# Patient Record
Sex: Female | Born: 1945
Health system: Southern US, Community
[De-identification: ages and names within clinical notes are randomized; demographics above are authoritative.]

## PROBLEM LIST (undated history)

## (undated) DIAGNOSIS — R911 Solitary pulmonary nodule: Secondary | ICD-10-CM

## (undated) DIAGNOSIS — R011 Cardiac murmur, unspecified: Secondary | ICD-10-CM

## (undated) DIAGNOSIS — M1990 Unspecified osteoarthritis, unspecified site: Secondary | ICD-10-CM

## (undated) DIAGNOSIS — H538 Other visual disturbances: Secondary | ICD-10-CM

## (undated) DIAGNOSIS — I10 Essential (primary) hypertension: Secondary | ICD-10-CM

## (undated) DIAGNOSIS — J302 Other seasonal allergic rhinitis: Secondary | ICD-10-CM

## (undated) DIAGNOSIS — E785 Hyperlipidemia, unspecified: Secondary | ICD-10-CM

## (undated) DIAGNOSIS — C801 Malignant (primary) neoplasm, unspecified: Secondary | ICD-10-CM

## (undated) DIAGNOSIS — F32A Depression, unspecified: Secondary | ICD-10-CM

## (undated) DIAGNOSIS — F329 Major depressive disorder, single episode, unspecified: Secondary | ICD-10-CM

## (undated) DIAGNOSIS — I2510 Atherosclerotic heart disease of native coronary artery without angina pectoris: Secondary | ICD-10-CM

## (undated) HISTORY — DX: Malignant (primary) neoplasm, unspecified: C80.1

## (undated) HISTORY — DX: Depression, unspecified: F32.A

## (undated) HISTORY — DX: Essential (primary) hypertension: I10

## (undated) HISTORY — DX: Unspecified osteoarthritis, unspecified site: M19.90

## (undated) HISTORY — PX: CATARACT EXTRACTION: SUR2

## (undated) HISTORY — DX: Solitary pulmonary nodule: R91.1

## (undated) HISTORY — DX: Cardiac murmur, unspecified: R01.1

## (undated) HISTORY — PX: TOTAL HIP ARTHROPLASTY: SHX124

## (undated) HISTORY — DX: Atherosclerotic heart disease of native coronary artery without angina pectoris: I25.10

## (undated) HISTORY — DX: Other seasonal allergic rhinitis: J30.2

## (undated) HISTORY — DX: Hyperlipidemia, unspecified: E78.5

## (undated) HISTORY — PX: BREAST BIOPSY: SHX20

## (undated) HISTORY — PX: SQUAMOUS CELL CARCINOMA EXCISION: SHX2433

## (undated) HISTORY — DX: Other visual disturbances: H53.8

## (undated) HISTORY — PX: COLONOSCOPY: SHX174

## (undated) HISTORY — PX: ABDOMINAL HYSTERECTOMY: SHX81

---
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
## 1998-03-21 ENCOUNTER — Other Ambulatory Visit: Admission: RE | Admit: 1998-03-21 | Discharge: 1998-03-21 | Payer: Self-pay | Admitting: Obstetrics and Gynecology

## 1999-05-04 ENCOUNTER — Encounter (INDEPENDENT_AMBULATORY_CARE_PROVIDER_SITE_OTHER): Payer: Self-pay | Admitting: Specialist

## 1999-05-04 ENCOUNTER — Other Ambulatory Visit: Admission: RE | Admit: 1999-05-04 | Discharge: 1999-05-04 | Payer: Self-pay | Admitting: Obstetrics and Gynecology

## 1999-05-06 ENCOUNTER — Other Ambulatory Visit: Admission: RE | Admit: 1999-05-06 | Discharge: 1999-05-06 | Payer: Self-pay | Admitting: Obstetrics and Gynecology

## 1999-09-21 ENCOUNTER — Encounter (INDEPENDENT_AMBULATORY_CARE_PROVIDER_SITE_OTHER): Payer: Self-pay

## 1999-09-21 ENCOUNTER — Other Ambulatory Visit: Admission: RE | Admit: 1999-09-21 | Discharge: 1999-09-21 | Payer: Self-pay | Admitting: Internal Medicine

## 2000-06-03 ENCOUNTER — Other Ambulatory Visit: Admission: RE | Admit: 2000-06-03 | Discharge: 2000-06-03 | Payer: Self-pay | Admitting: Obstetrics and Gynecology

## 2001-06-09 ENCOUNTER — Other Ambulatory Visit: Admission: RE | Admit: 2001-06-09 | Discharge: 2001-06-09 | Payer: Self-pay | Admitting: Obstetrics and Gynecology

## 2001-10-12 ENCOUNTER — Encounter (INDEPENDENT_AMBULATORY_CARE_PROVIDER_SITE_OTHER): Payer: Self-pay | Admitting: Specialist

## 2001-10-12 ENCOUNTER — Ambulatory Visit (HOSPITAL_COMMUNITY): Admission: RE | Admit: 2001-10-12 | Discharge: 2001-10-12 | Payer: Self-pay | Admitting: Obstetrics and Gynecology

## 2002-01-04 HISTORY — PX: REDUCTION MAMMAPLASTY: SUR839

## 2002-06-13 ENCOUNTER — Other Ambulatory Visit: Admission: RE | Admit: 2002-06-13 | Discharge: 2002-06-13 | Payer: Self-pay | Admitting: Obstetrics and Gynecology

## 2002-12-04 ENCOUNTER — Encounter (INDEPENDENT_AMBULATORY_CARE_PROVIDER_SITE_OTHER): Payer: Self-pay

## 2002-12-04 ENCOUNTER — Observation Stay (HOSPITAL_COMMUNITY): Admission: RE | Admit: 2002-12-04 | Discharge: 2002-12-05 | Payer: Self-pay | Admitting: Obstetrics and Gynecology

## 2003-02-19 ENCOUNTER — Encounter: Payer: Self-pay | Admitting: Internal Medicine

## 2003-11-19 ENCOUNTER — Ambulatory Visit: Payer: Self-pay | Admitting: Internal Medicine

## 2003-12-06 ENCOUNTER — Ambulatory Visit: Payer: Self-pay | Admitting: Internal Medicine

## 2003-12-12 ENCOUNTER — Other Ambulatory Visit: Admission: RE | Admit: 2003-12-12 | Discharge: 2003-12-12 | Payer: Self-pay | Admitting: Obstetrics and Gynecology

## 2004-03-19 ENCOUNTER — Ambulatory Visit: Payer: Self-pay | Admitting: Internal Medicine

## 2004-03-26 ENCOUNTER — Ambulatory Visit: Payer: Self-pay | Admitting: Internal Medicine

## 2004-07-15 ENCOUNTER — Ambulatory Visit: Payer: Self-pay | Admitting: Internal Medicine

## 2005-01-07 ENCOUNTER — Other Ambulatory Visit: Admission: RE | Admit: 2005-01-07 | Discharge: 2005-01-07 | Payer: Self-pay | Admitting: Obstetrics and Gynecology

## 2005-03-04 ENCOUNTER — Ambulatory Visit: Payer: Self-pay | Admitting: Internal Medicine

## 2005-03-23 ENCOUNTER — Ambulatory Visit: Payer: Self-pay | Admitting: Internal Medicine

## 2005-03-29 ENCOUNTER — Ambulatory Visit: Payer: Self-pay | Admitting: Internal Medicine

## 2006-02-28 ENCOUNTER — Ambulatory Visit: Payer: Self-pay | Admitting: Internal Medicine

## 2006-02-28 LAB — CONVERTED CEMR LAB
ALT: 24 units/L (ref 0–40)
AST: 24 units/L (ref 0–37)
BUN: 10 mg/dL (ref 6–23)
Cholesterol: 185 mg/dL (ref 0–200)
Creatinine, Ser: 0.9 mg/dL (ref 0.4–1.2)
Creatinine,U: 54.3 mg/dL
HDL: 65.9 mg/dL (ref >39.0)
Hgb A1c MFr Bld: 5.7 % (ref 4.6–6.0)
LDL Cholesterol: 86 mg/dL (ref 0–99)
Microalb Creat Ratio: 38.7 mg/g — ABNORMAL HIGH (ref 0.0–30.0)
Microalb, Ur: 2.1 mg/dL — ABNORMAL HIGH (ref 0.0–1.9)
Potassium: 4.5 meq/L (ref 3.5–5.1)
Total CHOL/HDL Ratio: 2.8
Triglycerides: 164 mg/dL — ABNORMAL HIGH (ref 0–149)
VLDL: 33 mg/dL (ref 0–40)
Vit D, 1,25-Dihydroxy: 46 (ref 20–57)

## 2007-04-19 ENCOUNTER — Telehealth (INDEPENDENT_AMBULATORY_CARE_PROVIDER_SITE_OTHER): Payer: Self-pay | Admitting: *Deleted

## 2007-04-20 ENCOUNTER — Ambulatory Visit: Payer: Self-pay | Admitting: Family Medicine

## 2007-04-20 ENCOUNTER — Encounter: Payer: Self-pay | Admitting: Internal Medicine

## 2007-05-08 ENCOUNTER — Encounter (INDEPENDENT_AMBULATORY_CARE_PROVIDER_SITE_OTHER): Payer: Self-pay | Admitting: *Deleted

## 2007-07-24 DIAGNOSIS — I10 Essential (primary) hypertension: Secondary | ICD-10-CM | POA: Insufficient documentation

## 2007-07-26 ENCOUNTER — Ambulatory Visit: Payer: Self-pay | Admitting: Internal Medicine

## 2007-07-26 DIAGNOSIS — M899 Disorder of bone, unspecified: Secondary | ICD-10-CM | POA: Insufficient documentation

## 2007-07-26 DIAGNOSIS — E785 Hyperlipidemia, unspecified: Secondary | ICD-10-CM | POA: Insufficient documentation

## 2007-07-26 DIAGNOSIS — Z860100 Personal history of colon polyps, unspecified: Secondary | ICD-10-CM | POA: Insufficient documentation

## 2007-07-26 DIAGNOSIS — M949 Disorder of cartilage, unspecified: Secondary | ICD-10-CM

## 2007-07-26 LAB — CONVERTED CEMR LAB
Cholesterol, target level: 200 mg/dL
HDL goal, serum: 50 mg/dL
LDL Goal: 125 mg/dL

## 2007-08-04 ENCOUNTER — Encounter (INDEPENDENT_AMBULATORY_CARE_PROVIDER_SITE_OTHER): Payer: Self-pay | Admitting: *Deleted

## 2007-08-04 ENCOUNTER — Ambulatory Visit: Payer: Self-pay | Admitting: Internal Medicine

## 2007-08-04 LAB — CONVERTED CEMR LAB
ALT: 25 units/L (ref 0–35)
AST: 24 units/L (ref 0–37)
Albumin: 4.3 g/dL (ref 3.5–5.2)
Alkaline Phosphatase: 50 units/L (ref 39–117)
BUN: 12 mg/dL (ref 6–23)
Basophils Absolute: 0 10*3/uL (ref 0.0–0.1)
Basophils Relative: 0.4 % (ref 0.0–3.0)
Bilirubin, Direct: 0.1 mg/dL (ref 0.0–0.3)
CO2: 34 meq/L — ABNORMAL HIGH (ref 19–32)
Calcium: 9.5 mg/dL (ref 8.4–10.5)
Chloride: 101 meq/L (ref 96–112)
Cholesterol: 150 mg/dL (ref 0–200)
Creatinine, Ser: 0.9 mg/dL (ref 0.4–1.2)
Eosinophils Absolute: 0.2 10*3/uL (ref 0.0–0.7)
Eosinophils Relative: 3.9 % (ref 0.0–5.0)
GFR calc Af Amer: 82 mL/min
GFR calc non Af Amer: 68 mL/min
Glucose, Bld: 102 mg/dL — ABNORMAL HIGH (ref 70–99)
HCT: 39.5 % (ref 36.0–46.0)
HDL: 61.2 mg/dL (ref >39.0)
Hemoglobin: 13.6 g/dL (ref 12.0–15.0)
Hgb A1c MFr Bld: 6 % (ref 4.6–6.0)
LDL Cholesterol: 71 mg/dL (ref 0–99)
Lymphocytes Relative: 35.8 % (ref 12.0–46.0)
MCHC: 34.4 g/dL (ref 30.0–36.0)
MCV: 94.2 fL (ref 78.0–100.0)
Monocytes Absolute: 0.3 10*3/uL (ref 0.1–1.0)
Monocytes Relative: 5.8 % (ref 3.0–12.0)
Neutro Abs: 2.3 10*3/uL (ref 1.4–7.7)
Neutrophils Relative %: 54.1 % (ref 43.0–77.0)
OCCULT 1: NEGATIVE
OCCULT 2: NEGATIVE
OCCULT 3: NEGATIVE
Platelets: 246 10*3/uL (ref 150–400)
Potassium: 4.2 meq/L (ref 3.5–5.1)
RBC: 4.2 M/uL (ref 3.87–5.11)
RDW: 11.9 % (ref 11.5–14.6)
Sodium: 140 meq/L (ref 135–145)
TSH: 1.28 microintl units/mL (ref 0.35–5.50)
Total Bilirubin: 0.9 mg/dL (ref 0.3–1.2)
Total CHOL/HDL Ratio: 2.5
Total Protein: 6.9 g/dL (ref 6.0–8.3)
Triglycerides: 91 mg/dL (ref 0–149)
VLDL: 18 mg/dL (ref 0–40)
Vit D, 1,25-Dihydroxy: 42 (ref 30–89)
WBC: 4.4 10*3/uL — ABNORMAL LOW (ref 4.5–10.5)

## 2008-06-14 ENCOUNTER — Ambulatory Visit: Payer: Self-pay | Admitting: Family Medicine

## 2008-06-14 ENCOUNTER — Ambulatory Visit (HOSPITAL_COMMUNITY): Admission: RE | Admit: 2008-06-14 | Discharge: 2008-06-14 | Payer: Self-pay | Admitting: Family Medicine

## 2008-06-14 LAB — CONVERTED CEMR LAB
Bilirubin Urine: NEGATIVE
Blood in Urine, dipstick: NEGATIVE
Glucose, Urine, Semiquant: NEGATIVE
Ketones, urine, test strip: NEGATIVE
Nitrite: NEGATIVE
Protein, U semiquant: NEGATIVE
Specific Gravity, Urine: 1.015
Urobilinogen, UA: 0.2
WBC Urine, dipstick: NEGATIVE
pH: 6

## 2008-06-17 ENCOUNTER — Telehealth (INDEPENDENT_AMBULATORY_CARE_PROVIDER_SITE_OTHER): Payer: Self-pay | Admitting: *Deleted

## 2008-07-26 ENCOUNTER — Ambulatory Visit: Payer: Self-pay | Admitting: Internal Medicine

## 2008-07-26 DIAGNOSIS — R7309 Other abnormal glucose: Secondary | ICD-10-CM | POA: Insufficient documentation

## 2008-07-31 ENCOUNTER — Encounter (INDEPENDENT_AMBULATORY_CARE_PROVIDER_SITE_OTHER): Payer: Self-pay | Admitting: *Deleted

## 2008-07-31 LAB — CONVERTED CEMR LAB
ALT: 20 units/L (ref 0–35)
AST: 24 units/L (ref 0–37)
Albumin: 4.3 g/dL (ref 3.5–5.2)
Alkaline Phosphatase: 62 units/L (ref 39–117)
BUN: 12 mg/dL (ref 6–23)
Basophils Absolute: 0 10*3/uL (ref 0.0–0.1)
Basophils Relative: 0 % (ref 0.0–3.0)
Bilirubin, Direct: 0 mg/dL (ref 0.0–0.3)
CO2: 33 meq/L — ABNORMAL HIGH (ref 19–32)
Calcium: 9.7 mg/dL (ref 8.4–10.5)
Chloride: 101 meq/L (ref 96–112)
Cholesterol: 172 mg/dL (ref 0–200)
Creatinine, Ser: 0.8 mg/dL (ref 0.4–1.2)
Eosinophils Absolute: 0.2 10*3/uL (ref 0.0–0.7)
Eosinophils Relative: 3.9 % (ref 0.0–5.0)
GFR calc non Af Amer: 77.45 mL/min (ref >60)
Glucose, Bld: 87 mg/dL (ref 70–99)
HCT: 39.3 % (ref 36.0–46.0)
HDL: 72.5 mg/dL (ref >39.00)
Hemoglobin: 13.6 g/dL (ref 12.0–15.0)
Hgb A1c MFr Bld: 5.7 % (ref 4.6–6.5)
LDL Cholesterol: 81 mg/dL (ref 0–99)
Lymphocytes Relative: 40 % (ref 12.0–46.0)
Lymphs Abs: 1.9 10*3/uL (ref 0.7–4.0)
MCHC: 34.7 g/dL (ref 30.0–36.0)
MCV: 94.5 fL (ref 78.0–100.0)
Monocytes Absolute: 0.2 10*3/uL (ref 0.1–1.0)
Monocytes Relative: 5 % (ref 3.0–12.0)
Neutro Abs: 2.4 10*3/uL (ref 1.4–7.7)
Neutrophils Relative %: 51.1 % (ref 43.0–77.0)
Platelets: 249 10*3/uL (ref 150.0–400.0)
Potassium: 4.1 meq/L (ref 3.5–5.1)
RBC: 4.16 M/uL (ref 3.87–5.11)
RDW: 11.9 % (ref 11.5–14.6)
Sodium: 141 meq/L (ref 135–145)
TSH: 1.45 microintl units/mL (ref 0.35–5.50)
Total Bilirubin: 1 mg/dL (ref 0.3–1.2)
Total CHOL/HDL Ratio: 2
Total Protein: 7.2 g/dL (ref 6.0–8.3)
Triglycerides: 95 mg/dL (ref 0.0–149.0)
VLDL: 19 mg/dL (ref 0.0–40.0)
WBC: 4.7 10*3/uL (ref 4.5–10.5)

## 2008-08-05 ENCOUNTER — Encounter (INDEPENDENT_AMBULATORY_CARE_PROVIDER_SITE_OTHER): Payer: Self-pay | Admitting: *Deleted

## 2008-08-05 LAB — CONVERTED CEMR LAB: Vit D, 25-Hydroxy: 35 ng/mL

## 2008-12-17 ENCOUNTER — Telehealth (INDEPENDENT_AMBULATORY_CARE_PROVIDER_SITE_OTHER): Payer: Self-pay | Admitting: *Deleted

## 2008-12-26 ENCOUNTER — Encounter (INDEPENDENT_AMBULATORY_CARE_PROVIDER_SITE_OTHER): Payer: Self-pay | Admitting: *Deleted

## 2009-02-06 ENCOUNTER — Encounter (INDEPENDENT_AMBULATORY_CARE_PROVIDER_SITE_OTHER): Payer: Self-pay | Admitting: *Deleted

## 2009-02-07 ENCOUNTER — Ambulatory Visit: Payer: Self-pay | Admitting: Internal Medicine

## 2009-02-10 ENCOUNTER — Ambulatory Visit: Payer: Self-pay | Admitting: Internal Medicine

## 2009-02-11 ENCOUNTER — Telehealth (INDEPENDENT_AMBULATORY_CARE_PROVIDER_SITE_OTHER): Payer: Self-pay | Admitting: *Deleted

## 2009-02-20 ENCOUNTER — Encounter: Payer: Self-pay | Admitting: Internal Medicine

## 2009-03-19 ENCOUNTER — Telehealth (INDEPENDENT_AMBULATORY_CARE_PROVIDER_SITE_OTHER): Payer: Self-pay | Admitting: *Deleted

## 2009-04-22 ENCOUNTER — Telehealth: Payer: Self-pay | Admitting: Internal Medicine

## 2009-04-25 ENCOUNTER — Ambulatory Visit (HOSPITAL_COMMUNITY): Admission: RE | Admit: 2009-04-25 | Discharge: 2009-04-25 | Payer: Self-pay | Admitting: Internal Medicine

## 2010-01-25 ENCOUNTER — Encounter: Payer: Self-pay | Admitting: Internal Medicine

## 2010-01-26 ENCOUNTER — Encounter: Payer: Self-pay | Admitting: Family Medicine

## 2010-02-03 NOTE — Letter (Signed)
Summary: Moviprep Instructions  [facility]  [address]   [place], [place] [address]   Phone: [phone]  Fax: [phone]       OTIS BURRESS    [birth year]    MRN: [id number]        Procedure Day /Date: 02-10-09 Monday     Arrival Time: 9:30 a.m.      Procedure Time: 10:30 a.m.     Location of Procedure:                     x   [facility] (4th Floor)                        PREPARATION FOR COLONOSCOPY WITH MOVIPREP   Starting 5 days prior to your procedure 02-05-09  do not eat nuts, seeds, popcorn, corn, beans, peas,  salads, or any raw vegetables.  Do not take any fiber supplements (e.g. Metamucil, Citrucel, and Benefiber).  THE DAY BEFORE YOUR PROCEDURE         DATE: 02-09-09    DAY: Monday  1.  Drink clear liquids the entire day-NO SOLID FOOD  2.  Do not drink anything colored red or purple.  Avoid juices with pulp.  No orange juice.  3.  Drink at least 64 oz. (8 glasses) of fluid/clear liquids during the day to prevent dehydration and help the prep work efficiently.  CLEAR LIQUIDS INCLUDE: Water Jello Ice Popsicles Tea (sugar ok, no milk/cream) Powdered fruit flavored drinks Coffee (sugar ok, no milk/cream) Gatorade Juice: apple, white grape, white cranberry  Lemonade Clear bullion, consomm, broth Carbonated beverages (any kind) Strained chicken noodle soup Hard Candy                             4.  In the morning, mix first dose of MoviPrep solution:    Empty 1 Pouch A and 1 Pouch B into the disposable container    Add lukewarm drinking water to the top line of the container. Mix to dissolve    Refrigerate (mixed solution should be used within 24 hrs)  5.  Begin drinking the prep at 5:00 p.m. The MoviPrep container is divided by 4 marks.   Every 15 minutes drink the solution down to the next mark (approximately 8 oz) until the full liter is complete.   6.  Follow completed prep with 16 oz of clear liquid of your choice  (Nothing red or purple).  Continue to drink clear liquids until bedtime.  7.  Before going to bed, mix second dose of MoviPrep solution:    Empty 1 Pouch A and 1 Pouch B into the disposable container    Add lukewarm drinking water to the top line of the container. Mix to dissolve    Refrigerate  THE DAY OF YOUR PROCEDURE      DATE:  02-10-09  DAY: Monday  Beginning at 5:30 a.m. (5 hours before procedure):         1. Every 15 minutes, drink the solution down to the next mark (approx 8 oz) until the full liter is complete.  2. Follow completed prep with 16 oz. of clear liquid of your choice.    3. You may drink clear liquids until  8:30 a.m.  (2 HOURS BEFORE PROCEDURE).   MEDICATION INSTRUCTIONS  Unless otherwise instructed, you should take regular prescription medications with a small sip of water  as early as possible the morning of your procedure.           OTHER INSTRUCTIONS  You will need a responsible adult at least 63 years of age to accompany you and drive you home.   This person must remain in the waiting room during your procedure.  Wear loose fitting clothing that is easily removed.  Leave jewelry and other valuables at home.  However, you may wish to bring a book to read or  an iPod/MP3 player to listen to music as you wait for your procedure to start.  Remove all body piercing jewelry and leave at home.  Total time from sign-in until discharge is approximately 2-3 hours.  You should go home directly after your procedure and rest.  You can resume normal activities the  day after your procedure.  The day of your procedure you should not:   Drive   Make legal decisions   Operate machinery   Drink alcohol   Return to work  You will receive specific instructions about eating, activities and medications before you leave.    The above instructions have been reviewed and explained to me by   Clide Cliff, RN______________________    I fully  understand and can verbalize these instructions _____________________________ Date _________

## 2010-02-03 NOTE — Progress Notes (Signed)
Summary: Triage   Phone Note Call from Patient Call back at [phone]   Caller: Patient Call For: Dr. Marina Goodell Reason for Call: Talk to Nurse Summary of Call: pt. is wanting to sch'd a Barium Enema Initial call taken by: Karna Christmas,  April 22, 2009 12:34 PM  Follow-up for Phone Call        Pt. is going to a meeting and asked that i call her back later.  Teryl Lucy RN  April 22, 2009 3:19 PM  Pt. scheduled for air contrast barium enema at [facility] on 04/25/09 at 9am. to be there at 8:45 am. She will go by there tomorrow and pick up prep kit. Follow-up by: Teryl Lucy RN,  April 22, 2009 5:06 PM

## 2010-02-03 NOTE — Miscellaneous (Signed)
Summary: RECALL COLON--CH.  Clinical Lists Changes  Medications: Added new medication of MOVIPREP 100 GM  SOLR (PEG-KCL-NACL-NASULF-NA ASC-C) As directed - Signed Rx of MOVIPREP 100 GM  SOLR (PEG-KCL-NACL-NASULF-NA ASC-C) As directed;  #1 x 0;  Signed;  Entered by: Clide Cliff RN;  Authorized by: Hilarie Fredrickson MD;  Method used: [facility]  [address] [phone]*, [address], [place], [place]  [address], Ph: [phone] or [phone], Fax: [phone] Observations: Added new observation of ALLERGY REV: Done (02/07/2009 12:44)    Prescriptions: MOVIPREP 100 GM  SOLR (PEG-KCL-NACL-NASULF-NA ASC-C) As directed  #1 x 0   Entered by:   Clide Cliff RN   Authorized by:   Hilarie Fredrickson MD   Signed by:   Clide Cliff RN on 02/07/2009   Method used:   [facility]        [facility]  [address] [phone]* (retail)       [address]       [place], [place]  [address]       Ph: [phone] or [phone]       Fax: [phone]   RxID:   [id number]

## 2010-02-03 NOTE — Procedures (Signed)
Summary: Colonoscopy  Patient: [name] Note: All result statuses are Final unless otherwise noted.  Tests: (1) Colonoscopy (COL)   COL Colonoscopy           DONE     [facility]     [address]     [place], [place]  [address]           COLONOSCOPY PROCEDURE REPORT           PATIENT:  Kathryn Lee, Kathryn Lee  MR#:  [id number]     BIRTHDATE:  01-19-1945, 63 yrs. old  GENDER:  female           ENDOSCOPIST:  Wilhemina Bonito. Eda Keys, MD     Referred by:  Surveillance Program Recall,           PROCEDURE DATE:  02/10/2009     PROCEDURE:  Surveillance Colonoscopy     ASA CLASS:  Class II     INDICATIONS:  history of pre-cancerous (adenomatous) colon polyps,     family history of colon cancer in parent (2001 hp; 2002 w/     adenoma; 2005 incomplete           MEDICATIONS:   Fentanyl 75 mcg IV, Versed 9 mg IV           DESCRIPTION OF PROCEDURE:   After the risks benefits and     alternatives of the procedure were thoroughly explained, informed     consent was obtained.  Digital rectal exam was performed and     revealed no abnormalities.   The LB CF-[id number] endoscope     was introduced through the anus and advanced to the mid transverse     colon, without limitations.  The quality of the prep was     excellent, using MoviPrep.  The instrument was then slowly     withdrawn as the colon was fully examined.     <<PROCEDUREIMAGES>>           FINDINGS:   The exam was again incomplete due to deep looping of     the colonoscope. The patient was moved side to side, pressure     applied, and repeat intubations tried without success. The     transverse, splenic flexure, descending, sigmoid colon, and rectum     appeared unremarkable.    Retroflexed views in the rectum revealed     internal hemorrhoids.    The scope was then withdrawn from the     patient and the procedure completed.           COMPLICATIONS:  None           ENDOSCOPIC IMPRESSION:     1) Normal colon to the  transverse portion     2) Incomplete exam as described above     3) Internal hemorrhoids     RECOMMENDATIONS:     1) Follow up  VIRTUAL COLONOSCOPY in 5 years     2) ACBE TO CLEAR PROXIMAL COLON OF POLYPS           ______________________________     Wilhemina Bonito. Eda Keys, MD           CC:  Pecola Lawless, MD; The Patient           n.     eSIGNED:   Wilhemina Bonito. Eda Keys at 02/10/2009 11:34 AM           Hermelinda Medicus, [id number]  Note: An exclamation  mark (!) indicates a result that was not dispersed into the flowsheet. Document Creation Date: 02/10/2009 11:35 AM _______________________________________________________________________  (1) Order result status: Final Collection or observation date-time: 02/10/2009 11:21 Requested date-time:  Receipt date-time:  Reported date-time:  Referring Physician:   Ordering Physician: Fransico Setters [phone]) Specimen Source:  Source: Launa Grill Order Number: [id number] Lab site:   Appended Document: Colonoscopy    Clinical Lists Changes  Observations: Added new observation of COLONNXTDUE: 02/2014 (02/10/2009 12:47)

## 2010-02-03 NOTE — Progress Notes (Signed)
Summary: Refill Request  Phone Note Refill Request Message from:  [facility] [address] Fax #: [phone]  Refills Requested: Medication #1:  LORAZEPAM 1 MG  TABS 1/2-1 at bedtime prn.   Dosage confirmed as above?Dosage Confirmed   Supply Requested: 3 months   Last Refilled: 09/10/2008 Initial call taken by: Harold Barban,  March 19, 2009 1:58 PM    Prescriptions: LORAZEPAM 1 MG  TABS (LORAZEPAM) 1/2-1 at bedtime prn  #60 x 1   Entered by:   Shonna Chock   Authorized by:   Marga Melnick MD   Signed by:   Shonna Chock on 03/19/2009   Method used:   Printed then faxed to ...       [facility]  [address] [phone]* (retail)       [address]       [place], [place]  [address]       Ph: [phone] or [phone]       Fax: [phone]   RxID:   [id number]

## 2010-02-03 NOTE — Letter (Signed)
Summary: Appointment Reminder  [facility]  [address] [place], [place] [address]   Phone: [phone]  Fax: [phone]        February 20, 2009 MRN: [id number]    Kathryn Lee [address] [place], [place]  [address]    Dear [name],   We have been unable to reach you by phone to schedule an air contrast barium enema that Dr.Perry recommended be done after doing colonoscopy on 02/10/2009.     We hope that you allow [place] to continue to participate in your health care needs. Please contact me at [phone] at your earliest convenience..     Sincerely,    Teryl Lucy RN

## 2010-02-03 NOTE — Progress Notes (Signed)
   Phone Note Outgoing Call   Summary of Call:  message left for pt. to call me back to schedule ACBE. Initial call taken by: Teryl Lucy RN,  February 11, 2009 10:56 AM  Follow-up for Phone Call         message left  again to call back   Teryl Lucy RN  February 17, 2009 9:22 AM  Follow-up by: Teryl Lucy RN,  February 17, 2009 9:22 AM  Additional Follow-up for Phone Call Additional follow up Details #1::        Pt. has not returned my calls about scheduling her x-ray so letter sent to pt. to call me. Additional Follow-up by: Teryl Lucy RN,  February 20, 2009 11:30 AM

## 2010-04-13 LAB — CREATININE, SERUM
Creatinine, Ser: 0.8 mg/dL (ref 0.4–1.2)
GFR calc Af Amer: >60 mL/min (ref >60)
GFR calc non Af Amer: >60 mL/min (ref >60)

## 2010-04-13 LAB — BUN: BUN: 8 mg/dL (ref 6–23)

## 2010-05-22 NOTE — Op Note (Signed)
Kathryn Lee, Kathryn Lee                      ACCOUNT NO.:  [id number]   MEDICAL RECORD NO.:  [id number]                   PATIENT TYPE:  AMB   LOCATION:  DAY                                  FACILITY:  [facility]   PHYSICIAN:  Daniel L. Eda Paschal, M.D.           DATE OF BIRTH:  May 28, 1945   DATE OF PROCEDURE:  12/04/2002  DATE OF DISCHARGE:                                 OPERATIVE REPORT   PREOPERATIVE DIAGNOSES:  1. Persistent postmenopausal bleeding.  2. Right adnexal pathology, possibly hydrosalpinx.   POSTOPERATIVE DIAGNOSES:  1. Persistent postmenopausal bleeding.  2. Multiple hydatid cysts.   OPERATION:  Vaginal hysterectomy, bilateral salpingo-oophorectomy.   SURGEON:  Daniel L. Eda Paschal, M.D.   FIRST ASSISTANT:  Timothy P. Fontaine, M.D.   FINDINGS:  The patient's uterus was just slightly enlarged, mobile, without  any evidence of pathology.  Left ovary was normal.  Left tube had a single  hydatid cyst.  Right ovary was normal.  Right fallopian tube had about four  or five multiple hydatid cysts, which gave it an appearance that could have  been consistent with a hydrosalpinx on ultrasound.  Other than this,  findings were normal.   DESCRIPTION OF PROCEDURE:  After adequate general endotracheal anesthesia,  the patient placed in the dorsal lithotomy position and prepped and draped  in the usual sterile manner.  A 1:200,000 solution of epinephrine and 0.5%  Xylocaine was injected around the cervix.  A 360 degree incision was made  around the cervix.  The bladder was mobilized superiorly, as was the  posterior peritoneum.  The posterior peritoneum and vesicouterine fold of  peritoneum were entered with sharp dissection.  The uterosacral ligaments  were clamped.  In clamping them, they were shortened.  They were sutured to  the vault laterally for good vault support.  Cardinal ligaments, uterine  arteries, balance of the broad ligament, round ligaments, and fallopian  tubes were successively clamped, cut, and suture ligated.  Suture material  for all the above-mentioned pedicles was #1 chromic catgut.  The uterine  arteries were doubly ligated.  At this point the uterus was delivered, sent  to pathology for tissue diagnosis.  The left adnexa could be visualized.  The IP ligament was clamped, cut, and doubly suture ligated with #1 chromic  catgut. The same procedure was done on the right, and once again the IP  ligament was doubly ligated with #1 chromic catgut.  Ovaries and tubes were  sent to pathology for tissue diagnosis.  The vaginal cuff was whipstitched  to the peritoneum with a running locked 0 Vicryl.  Two sponge, needle, and  instrument counts were correct.  The peritoneum and the cuff were then  closed with figure-of-eights of #1 chromic, picking up peritoneum  posteriorly in such a fashion to completely eliminate the cul-de-sac.  The  cuff was completely closed.  Two sponge, needle, and instrument counts  were correct.  A Foley catheter  was inserted, which drained clear urine.  Estimated blood loss for the entire procedure was 150 mL with none replaced.  Following this procedure she was repositioned for a reduction mammoplasty by  Alfredia Ferguson, M.D.                                               Daniel L. Eda Paschal, M.D.    Tonette Bihari  D:  12/04/2002  T:  12/04/2002  Job:  [id number]

## 2010-05-22 NOTE — Op Note (Signed)
   Kathryn Lee, Kathryn Lee                      ACCOUNT NO.:  [id number]   MEDICAL RECORD NO.:  [id number]                   PATIENT TYPE:  AMB   LOCATION:  SDC                                  FACILITY:  [facility]   PHYSICIAN:  Daniel L. Eda Paschal, M.D.           DATE OF BIRTH:  1945/03/09   DATE OF PROCEDURE:  10/12/2001  DATE OF DISCHARGE:                                 OPERATIVE REPORT   PREOPERATIVE DIAGNOSES:  1. Postmenopausal bleeding.  2. Endometrial polyp.   POSTOPERATIVE DIAGNOSES:  1. Postmenopausal bleeding.  2. Endometrial polyp.   NAME OF OPERATION:  Hysteroscopy, dilatation and curettage.   SURGEON:  Daniel L. Eda Paschal, M.D.   ANESTHESIA:  General.   INDICATIONS:  The patient is a 65 year old postmenopausal woman who stopped  her hormone replacement therapy in February.  Starting in July on no hormone  replacement therapy she has been having her period every two weeks.  On SIH  she has an endometrial polyp and she now enters the hospital for  hysteroscopy, D&C, and excision of the above.   FINDINGS:  External and vaginal is within normal limits.  Cervix is clean.  Uterus is retroverted, normal size and shape with first degree uterine  descensus.  Adnexa are palpably normal.  At the time of hysteroscopy patient  had a small endometrial polyp of about 1.5 cm coming off the posterior wall.  Other than this endometrial polyp the entire endometrial cavity and  intracervical cavity were normal.   PROCEDURE:  After adequate general anesthesia the patient was placed in the  dorsal lithotomy position, prepped and draped in the usual sterile manner.  A single tooth tenaculum was placed on the anterior lip of the cervix.  The  cervix was dilated to a number 31 Pratt dilator.  A hysteroscopic  examination was then done using the hysteroscopic resectoscope and using 3%  sorbitol to distend the intrauterine cavity.  Camera was used for  magnification.  The resectoscope was  utilized for the above.  An endometrial  polyp was identified on the posterior wall.  It was removed using several  size curettes and other endometrial sampling was also obtained.  The patient  was then rehysteroscoped and there was no defects left in the endometrial  cavity.  Estimated blood loss for the entire procedure was less than 50 cc  with none replaced.  Fluid deficit was small, probably less than 100 cc.  The patient tolerated procedure well and left the operating room in  satisfactory condition.                                               Daniel L. Eda Paschal, M.D.    Tonette Bihari  D:  10/12/2001  T:  10/12/2001  Job:  [id number]

## 2010-05-22 NOTE — Op Note (Signed)
NAMETRACYE, SZUCH                      ACCOUNT NO.:  [id number]   MEDICAL RECORD NO.:  [id number]                   PATIENT TYPE:  AMB   LOCATION:  DAY                                  FACILITY:  [facility]   PHYSICIAN:  Alfredia Ferguson, M.D.               DATE OF BIRTH:  January 16, 1945   DATE OF PROCEDURE:  12/04/2002  DATE OF DISCHARGE:                                 OPERATIVE REPORT   PREOPERATIVE DIAGNOSIS:  Symptomatic macromastia with slight asymmetry,  right side larger than left.   POSTOPERATIVE DIAGNOSIS:  Symptomatic macromastia with slight asymmetry,  right side larger than left.   OPERATION PERFORMED:  Bilateral reduction mammoplasty with inferior pedicle  technique.  398 g removed from left breast and 459 g removed from right  breast.   SURGEON:  Alfredia Ferguson, M.D.   FIRST ASSISTANT:  Jacky Kindle, R.N.F.A.   ANESTHESIA:  General endotracheal anesthesia.   INDICATION FOR SURGERY:  This is a 65 year old woman who is undergoing a  vaginal hysterectomy today.  She also has documented symptomatic  macromastia.  She wishes to be reduced from her present D cup to a small B  if possible.  Potential risks of the surgery, including over-reduction,  under-reduction, wound healing difficulties, vascular compromise to the  nipple, hematoma, seroma, bleeding postoperatively, the need for secondary  surgery, asymmetry of the breasts, and overall dissatisfaction were  discussed with the patient.  In spite of these and other risks discussed,  the patient wishes to proceed with the operation.   DESCRIPTION OF SURGERY:  Skin marks were placed on the day prior to surgery  outlining a Wise skin pattern for an inferior pedicle technique.  Following  the hysterectomy the patient's chest was prepped with Betadine and draped  with sterile drapes.  Attention was directed to the left breast, which was  the smaller of the two breasts.  An 8 cm wide inferiorly-based pedicle was  marked  within the confines of the previously-placed skin marks.  A 42 mm  diameter circle was drawn around the nipple.  The circle around the nipple  was incised.  The marked pedicle was incised and within the confines of this  incision, the pedicle was de-epithelialized.  The remainder of the  previously-placed marks were incised, including the inframammary crease  incision, the medial, superior, and lateral breast flap incisions.  The  pedicle was dissected away from the surrounding breast tissue, cutting on  the bias and going away from the midclavicular line medially and laterally  and going superiorly on the pedicle toward the chest wall.  This left  adequate connections to the chest wall to ensure vascular integrity of the  pedicle.  The superior, medial, and lateral breast flaps were now elevated  off the chest wall at the level of the pectoralis fascia.  The excess  dermoglandular tissue medially was dissected away from the medial breast  flap, thinning  the medial breast flap to approximately 3-4 cm in thickness.  The excess dermoglandular tissue from the superior breast flap was also  dissected away from the superior breast flap, thinning the superior breast  flap to approximately 3-4 cm in thickness.  The excess dermoglandular tissue  was dissected away from the lateral breast flap, thinning this breast flap  to 2-3 cm in thickness.  Excess tissue was now passed off the field and  weighed.  The total weight was 398 g from the left breast.  Hemostasis was  meticulously maintained throughout the dissection.  The wound was copiously  irrigated with warm saline irrigation.  The inferior corner of the vertical  limb of the medial and lateral breast flap was united to the midportion of  the inframammary crease incision with a 2-0 Vicryl suture.  The superior  corner of the vertical limb of the two breast flaps were united together  with a similar suture.  The nipple-areolar complex was placed  in its new  location and fixed in position using multiple interrupted 3-0 Vicryl sutures  for the dermis, followed by a running 4-0 Vicryl subcuticular for the skin  edges.  The inframammary crease incision was closed using multiple 3-0  Vicryl sutures for the dermis, followed by running 3-0 Vicryl subcuticular.  The vertical limb of the incision was closed in a similar fashion.  Attention was directed to the right breast, where an identical procedure was  performed, and 459 g was removed from the right breast.  Estimated blood  loss for the procedure was between 250 and 300 mL.  Hemostasis was ensured  prior to closure of both wounds.  The nipple-areolar complex was viable  bilaterally.  The patient's chest was now cleansed, dried, and a bulky  dressing was placed over the breast.  A circumferential wrap with a six-inch  Ace bandage was placed.  The patient tolerated the procedure well, was  awakened, extubated, and transported to the recovery room in satisfactory  condition.                                               Alfredia Ferguson, M.D.    WBB/MEDQ  D:  12/04/2002  T:  12/04/2002  Job:  [id number]

## 2010-05-22 NOTE — H&P (Signed)
Kathryn Lee, MANARD                      ACCOUNT NO.:  [id number]   MEDICAL RECORD NO.:  [id number]                   PATIENT TYPE:  AMB   LOCATION:  DAY                                  FACILITY:  [facility]   PHYSICIAN:  Daniel L. Eda Paschal, M.D.           DATE OF BIRTH:  Aug 03, 1945   DATE OF ADMISSION:  DATE OF DISCHARGE:                                HISTORY & PHYSICAL   CHIEF COMPLAINT:  Persistent postmenopausal bleeding.   HISTORY OF PRESENT ILLNESS:  The patient is a 65 year old, gravida 1, para  1, AB 0, who has now been postmenopausal for two and a half to three years.  For approximately the first year, she was on a hormone replacement therapy  but stopped it.  Since she stopped it, she had two episodes of  postmenopausal bleeding.  Endometrial biopsy was negative.  Sonohistogram  was done on the patient and had an endometrial polyp.  So she had a D&C  hysteroscopy with excision of endometrial polyps.   Since then, she has continued to have episodes of intermittent  postmenopausal bleeding.  She no longer has any evidence of a polyp and as a  result of this persistent postmenopausal bleeding and to avoid continued  resurveillance for malignancy, she now enters the hospital for vaginal  hysterectomy and bilateral salpingo-oophorectomy.  She would like to have  her ovaries removed even if it is not possible vaginally, so we will proceed  with laparoscopy following vaginal hysterectomy if appropriate to remove her  ovaries.  Please note that on her last ultrasound she did have a possible  hydrosalpinx of her right fallopian tube versus several small right ovarian  cyst.   Following this part of the surgery, if it goes well, she will undergo a  bilateral breast reduction procedure.   PAST MEDICAL HISTORY:  1. The patient has a history of elevated lipids and is on Pravachol.  2. She is hypertensive and is on Norvasc.  3. She also has thinning of her bones and is on  Fosamax.   ALLERGIES:  She is allergic to no drugs.   FAMILY HISTORY:  The patient has a family history of skin cancer.  Both her  parents have had colon cancer and her mother is hypertensive.   SOCIAL HISTORY:  She is a nonsmoker.  She drinks one glass of wine a day.  She does not use caffeine.   REVIEW OF SYMPTOMS:  Negative.  CARDIAC:  Reveals a history of hypertension  on Norvasc.  RESPIRATORY:  Negative.  GASTROINTESTINAL:  Negative.  GENITOURINARY:  Negative.  NEUROLOGICAL:  Negative except for bone thinning  on Fosamax.  ENDOCRINE:  Reveals elevated lipids on Pravachol.  IMMUNOLOGICAL:  Negative.  LYMPHATIC:  Negative.  PSYCHIATRIC:  Negative.   PHYSICAL EXAMINATION:  GENERAL:  The patient is a well-developed, well-  nourished female in no acute distress.  Blood pressure is 130/80, pulse is  80 and regular,  respirations 16 and nonlabored.  She is afebrile.  HEENT:  All within normal limits.  NECK:  Supple.  Trachea in the midline.  Thyroid is not enlarged.  LUNGS:  Clear to auscultation and percussion.  HEART:  No thrills, heaves, or murmurs.  BREASTS:  No masses.  ABDOMEN:  Soft without guarding, rebound, or masses.  PELVIC:  External and vaginal is within normal limits.  Cervix is clean.  Uterus is top normal, size and shape.  Adnexae are palpably normal.  Rectovaginal is confirmatory.  EXTREMITIES:  Within normal limits.   IMPRESSION:  Persistent postmenopausal bleeding.   PLAN:  Vaginal hysterectomy and bilateral salpingo-oophorectomy.                                               Daniel L. Eda Paschal, M.D.    Tonette Bihari  D:  12/03/2002  T:  12/03/2002  Job:  [id number]

## 2014-06-11 ENCOUNTER — Encounter: Payer: Self-pay | Admitting: Internal Medicine

## 2016-09-09 DIAGNOSIS — F419 Anxiety disorder, unspecified: Secondary | ICD-10-CM | POA: Diagnosis present

## 2017-08-29 DIAGNOSIS — K219 Gastro-esophageal reflux disease without esophagitis: Secondary | ICD-10-CM | POA: Diagnosis present

## 2018-06-07 ENCOUNTER — Other Ambulatory Visit: Payer: Self-pay | Admitting: Internal Medicine

## 2018-06-07 DIAGNOSIS — N6310 Unspecified lump in the right breast, unspecified quadrant: Secondary | ICD-10-CM

## 2018-06-15 ENCOUNTER — Other Ambulatory Visit: Payer: Self-pay

## 2018-06-23 ENCOUNTER — Ambulatory Visit
Admission: RE | Admit: 2018-06-23 | Discharge: 2018-06-23 | Disposition: A | Discharge status: Home / Self Care | Payer: Medicare Other | Source: Ambulatory Visit | Attending: Internal Medicine | Admitting: Internal Medicine

## 2018-06-23 ENCOUNTER — Other Ambulatory Visit: Payer: Self-pay

## 2018-06-23 ENCOUNTER — Other Ambulatory Visit: Payer: Self-pay | Admitting: Internal Medicine

## 2018-06-23 DIAGNOSIS — N6310 Unspecified lump in the right breast, unspecified quadrant: Secondary | ICD-10-CM

## 2018-06-27 ENCOUNTER — Ambulatory Visit
Admission: RE | Admit: 2018-06-27 | Discharge: 2018-06-27 | Disposition: A | Discharge status: Home / Self Care | Payer: Medicare Other | Source: Ambulatory Visit | Attending: Internal Medicine | Admitting: Internal Medicine

## 2018-06-27 ENCOUNTER — Other Ambulatory Visit: Payer: Self-pay

## 2018-06-27 DIAGNOSIS — N6310 Unspecified lump in the right breast, unspecified quadrant: Secondary | ICD-10-CM

## 2018-06-29 ENCOUNTER — Telehealth: Payer: Self-pay | Admitting: Internal Medicine

## 2018-06-29 NOTE — Telephone Encounter (Signed)
According to chart pt had a colon done in 2016 with a provider in [place]. She would need an OV prior to scheduling her procedure here.

## 2018-06-29 NOTE — Telephone Encounter (Signed)
There is a [facility] referral for pt to have colonoscopy.  Previous colonoscopy wasin 2011 with Dr. Henrene Pastor.  Pt c FHx of colon cancer.  There is no recall.  Please advise if we can schedule her for direct colon.

## 2018-07-05 ENCOUNTER — Telehealth: Payer: Self-pay

## 2018-07-05 NOTE — Telephone Encounter (Signed)
Phone screening complete 

## 2018-07-06 ENCOUNTER — Encounter: Payer: Self-pay | Admitting: Internal Medicine

## 2018-07-06 ENCOUNTER — Ambulatory Visit (INDEPENDENT_AMBULATORY_CARE_PROVIDER_SITE_OTHER): Payer: Medicare Other | Admitting: Internal Medicine

## 2018-07-06 VITALS — Ht 66.0 in | Wt 171.0 lb

## 2018-07-06 DIAGNOSIS — Z86010 Personal history of colonic polyps: Secondary | ICD-10-CM | POA: Diagnosis not present

## 2018-07-06 DIAGNOSIS — Z800 Family history of malignant neoplasm of digestive organs: Secondary | ICD-10-CM | POA: Diagnosis not present

## 2018-07-06 NOTE — Progress Notes (Signed)
HISTORY OF PRESENT ILLNESS:  Kathryn Lee is a 73 y.o. female, required registrar at [employer], with past medical history as listed below who presents today regarding the need for surveillance colonoscopy.  The patient reports a family history of colon cancer in both parents.  She is undergone multiple prior colonoscopies.  Colonoscopy 2001 with hyperplastic polyp.  Colonoscopy in 2002 with adenoma.  Colonoscopy in 2005 was incomplete.  Colonoscopy in 2011 was also incomplete due to redundant colon.  Barium enema in 2011 revealed no evidence of masses or polypoid lesions.  Incidental right-sided diverticulum and tortuosity of the sigmoid colon noted.  Most recent colonoscopy was performed at [facility] December 20, 2014.  Patient was said to have a complete exam to the cecum.  Exam was said to be limited by suboptimal preparation in the right colon.  Patient reports that she was still at that time did not complete her prep.  Left-sided diverticulosis noted.  2 diminutive rectosigmoid colon polyps were removed and found to be hyperplastic.  Due to the limitations of her preparation and her family history follow-up in 3 years was recommended.  She wishes to have the exam done at this time.  She tells me that her GI review of systems is negative.  No problems with constipation or rectal bleeding.  Review of x-ray and laboratory records reveals no relevant abnormalities.  REVIEW OF SYSTEMS:  All non-GI ROS negative e unless otherwise stated in the HPI (entirely negative)  Past Medical History:  Diagnosis Date  . Depression   . Hyperlipidemia   . Hypertension     Past Surgical History:  Procedure Laterality Date  . REDUCTION MAMMAPLASTY Bilateral 2004  . TOTAL HIP ARTHROPLASTY      Social History Kathryn Lee  reports that she has never smoked. She does not have any smokeless tobacco history on file. She reports current alcohol use. She reports that she does not use  drugs.  family history includes Alzheimer's disease in her mother; Colon cancer (age of onset: 49) in her mother; Colon cancer (age of onset: 36) in her father; Liver cancer in her father.  Allergies  Allergen Reactions  . Codeine   . Morphine        PHYSICAL EXAMINATION: No physical examination with telehealth visit  ASSESSMENT:  1.  Family history of colon cancer 2.  History of adenomatous colon polyp remotely 3.  Last colonoscopy elsewhere 2016 with hyperplastic polyps.  Limited by inadequate right colon preparation.  Now due for follow-up.  We discussed hard candy and antiemetics prophylactically to help in the preparation process 4.  History of REDUNDANT colon with INCOMPLETE examination on 2 occasions  PLAN:  1.  Colonoscopy.The nature of the procedure, as well as the risks, benefits, and alternatives were carefully and thoroughly reviewed with the patient. Ample time for discussion and questions allowed. The patient understood, was satisfied, and agreed to proceed. 2.  PRESCRIBE Zofran 4 mg; #2; take 1 by mouth 20 minutes prior to starting each prep session, for nausea  This telehealth medicine visit was initiated by the patient and consented for by the patient.  She was in her home and I was in my office during the encounter.  She understands her may be an associated professional charge for this service

## 2018-07-10 MED ORDER — NA SULFATE-K SULFATE-MG SULF 17.5-3.13-1.6 GM/177ML PO SOLN: 1.0000 | Freq: Once | ORAL | 0 refills | Status: AC

## 2018-07-10 MED ORDER — ONDANSETRON HCL 4 MG PO TABS: ORAL_TABLET | ORAL | 0 refills | Status: DC

## 2018-07-10 NOTE — Addendum Note (Signed)
Addended by: Audrea Muscat on: 07/10/2018 10:42 AM   Modules accepted: Orders

## 2018-07-10 NOTE — Patient Instructions (Signed)
You have been scheduled for a colonoscopy. Please follow written instructions given to you at your visit today.  Please pick up your prep supplies at the pharmacy within the next 1-3 days.  We have sent the following medications to your pharmacy for you to pick up at your convenience:  Zofran.  Take one tablet 20 minutes before drinking each half of the prep.

## 2018-08-02 ENCOUNTER — Ambulatory Visit: Payer: Self-pay

## 2018-08-02 ENCOUNTER — Encounter: Payer: Self-pay | Admitting: Orthopaedic Surgery

## 2018-08-02 ENCOUNTER — Other Ambulatory Visit: Payer: Self-pay

## 2018-08-02 ENCOUNTER — Ambulatory Visit (INDEPENDENT_AMBULATORY_CARE_PROVIDER_SITE_OTHER): Payer: Medicare Other | Admitting: Orthopaedic Surgery

## 2018-08-02 DIAGNOSIS — Z96643 Presence of artificial hip joint, bilateral: Secondary | ICD-10-CM | POA: Diagnosis not present

## 2018-08-02 DIAGNOSIS — M25552 Pain in left hip: Secondary | ICD-10-CM

## 2018-08-02 NOTE — Progress Notes (Signed)
Office Visit Note   Patient: Kathryn Lee           Date of Birth: 06-05-1945           MRN: [id number] Visit Date: 08/02/2018              Requested by: Leanna Battles, [address] [place],  [place] [id number] PCP: Leanna Battles, MD   Assessment & Plan: Visit Diagnoses:  1. Pain in left hip   2. History of bilateral total hip arthroplasty     Plan: I have told her for the short-term I would like her to get away from going barefoot for at least the next month and try an insert just in the right shoe because her right side is slightly shorter than left.  This may help with gait and decrease the pain that she is feeling.  I would also like to obtain a three-phase bone scan to rule out prosthetic loosening of the left hip given the lucency of seen at the superior lateral aspect of the acetabular component.  An MRI would also be helpful to assess the left hip and the structures around the left hip for any pathology that is causing her pain.  All question concerns were answered and addressed.  We will see her back in 4 weeks.  Follow-Up Instructions: Return in about 4 weeks (around 08/30/2018).   Orders:  Orders Placed This Encounter  Procedures  . Hand/UE Inj  . Foot Inj  . XR HIP UNILAT W OR W/O PELVIS 1V LEFT   No orders of the defined types were placed in this encounter.     Procedures: No procedures performed   Clinical Data: No additional findings.   Subjective: Chief Complaint  Patient presents with  . Left Hip - Pain  Patient is a very pleasant 73 year old female who has a history of bilateral hip replacements that were done in [facility].  They were done just a few years apart.  She has had some problems with her left hip hurting her.  That was done in 2017.  She does report mainly hamstring pain is where she points to and states that it is hamstring pain but occasionally she does get groin pain.  This is worse with activities.  She  has not a diabetic and not a smoker.  She denies any numbness and tingling in her feet or legs or weakness.  She does walk mainly barefooted.  She has been told that there may be a slight leg length discrepancy.  She is very satisfied with her right hip replacement.  She is incredibly active and does not need to get around with any assistive device at all.  She is up and down her feet and legs all day long he can squat easily.  She denies any sciatic symptoms.  She denies any back pain.  HPI  Review of Systems She currently denies any headache, chest pain, shortness of breath, fever, chills, nausea, vomiting  Objective: Vital Signs: There were no vitals taken for this visit.  Physical Exam She is alert and orient x3 and in no acute distress Ortho Exam Examination of both hips show the move fluidly and fully with no pain in the groin at all my exam today.  Her knee exams are equal bilaterally she has 5 out of 5 strength in both lower extremities.  She has normal sensation both legs and feet.  I did lay her supine and she does have  a slight leg length with crepitus knee there is very slight with her right side being just slightly shorter left side. Specialty Comments:  No specialty comments available.  Imaging: No results found.   PMFS History: Patient Active Problem List   Diagnosis Date Noted  . FASTING HYPERGLYCEMIA 07/26/2008  . HYPERLIPIDEMIA 07/26/2007  . OSTEOPENIA 07/26/2007  . COLONIC POLYPS, HX OF 07/26/2007  . HYPERTENSION 07/24/2007   Past Medical History:  Diagnosis Date  . Depression   . Hyperlipidemia   . Hypertension     Family History  Problem Relation Age of Onset  . Colon cancer Mother 72  . Alzheimer's disease Mother   . Colon cancer Father 54  . Liver cancer Father     Past Surgical History:  Procedure Laterality Date  . REDUCTION MAMMAPLASTY Bilateral 2004  . TOTAL HIP ARTHROPLASTY     Social History   Occupational History  . Not on file   Tobacco Use  . Smoking status: Never Smoker  Substance and Sexual Activity  . Alcohol use: Yes    Comment: daily  . Drug use: Never  . Sexual activity: Not on file

## 2018-08-03 ENCOUNTER — Other Ambulatory Visit: Payer: Self-pay

## 2018-08-03 DIAGNOSIS — Z96642 Presence of left artificial hip joint: Secondary | ICD-10-CM

## 2018-08-30 ENCOUNTER — Ambulatory Visit: Payer: Medicare Other | Admitting: Orthopaedic Surgery

## 2018-08-30 ENCOUNTER — Telehealth: Payer: Self-pay | Admitting: *Deleted

## 2018-08-30 NOTE — Telephone Encounter (Signed)
Covid-19 screening questions   Do you now or have you had a fever in the last 14 days? no  Do you have any respiratory symptoms of shortness of breath or cough now or in the last 14 days? no  Do you have any family members or close contacts with diagnosed or suspected Covid-19 in the past 14 days? no  Have you been tested for Covid-19 and found to be positive? No        

## 2018-08-31 ENCOUNTER — Encounter: Payer: Self-pay | Admitting: Internal Medicine

## 2018-08-31 ENCOUNTER — Ambulatory Visit (AMBULATORY_SURGERY_CENTER): Payer: Medicare Other | Admitting: Internal Medicine

## 2018-08-31 ENCOUNTER — Other Ambulatory Visit: Payer: Self-pay

## 2018-08-31 VITALS — BP 131/69 | HR 61 | Temp 96.6°F | Resp 13 | Ht 66.0 in | Wt 171.0 lb

## 2018-08-31 DIAGNOSIS — Z538 Procedure and treatment not carried out for other reasons: Secondary | ICD-10-CM | POA: Diagnosis not present

## 2018-08-31 DIAGNOSIS — Z86010 Personal history of colonic polyps: Secondary | ICD-10-CM | POA: Diagnosis not present

## 2018-08-31 MED ORDER — SODIUM CHLORIDE 0.9 % IV SOLN: 500.0000 mL | Freq: Once | INTRAVENOUS | Status: DC

## 2018-08-31 MED ORDER — FLEET ENEMA 7-19 GM/118ML RE ENEM
1.0000 | ENEMA | Freq: Once | RECTAL | Status: AC
  Administered 2018-08-31: 1 via RECTAL

## 2018-08-31 NOTE — Progress Notes (Signed)
Report to PACU, RN, vss, BBS= Clear.  

## 2018-08-31 NOTE — Op Note (Signed)
[facility] Patient Name: Kathryn Lee Procedure Date: 08/31/2018 8:02 AM MRN: [id number] Endoscopist: Docia Chuck. Henrene Pastor , MD Age: 73 Referring MD:  Date of Birth: [birth year] Gender: Female Account #: [id number] Procedure:                Colonoscopy Indications:              High risk colon cancer surveillance: Personal                            history of non-advanced adenoma Medicines:                Monitored Anesthesia Care Procedure:                Pre-Anesthesia Assessment:                           - Prior to the procedure, a History and Physical                            was performed, and patient medications and                            allergies were reviewed. The patient's tolerance of                            previous anesthesia was also reviewed. The risks                            and benefits of the procedure and the sedation                            options and risks were discussed with the patient.                            All questions were answered, and informed consent                            was obtained. Prior Anticoagulants: The patient has                            taken no previous anticoagulant or antiplatelet                            agents. ASA Grade Assessment: II - A patient with                            mild systemic disease. After reviewing the risks                            and benefits, the patient was deemed in                            satisfactory condition to undergo the procedure.  After obtaining informed consent, the colonoscope                            was passed under direct vision. Throughout the                            procedure, the patient's blood pressure, pulse, and                            oxygen saturations were monitored continuously. The                            Colonoscope was introduced through the anus and                            advanced to the the simoid colon.  the procedure was                            aborted due to poor prep (formed stool). No                            anatomical landmarks were photographed. The quality                            of the bowel preparation was poor. The colonoscopy                            was performed without difficulty. The patient                            tolerated the procedure well. The bowel preparation                            used was SUPREP via split dose instruction and                            enemas. Scope In: 8:22:45 AM Scope Out: 8:23:45 AM Total Procedure Duration: 0 hours 1 minute 0 seconds  Findings:                 The rectum and distal sigmoid colon were obscured                            by solid stool. Exam was aborted Complications:            No immediate complications. Estimated blood loss:                            None. Estimated Blood Loss:     Estimated blood loss: none. Impression:               1. Aborted examination due to poor prep                           2. Intolerant to perhaps. Recommendation:  1. Would recommend virtual colonoscopy in the next                            few months if patient is willing. I would not                            pursue repeat colonoscopy given the history of                            multiple incomplete examinations for various                            reasons. Docia Chuck. Henrene Pastor, MD 08/31/2018 8:42:33 AM This report has been signed electronically.

## 2018-08-31 NOTE — Progress Notes (Signed)
Temperature- Kathryn Lee  VS Kathryn Lee  Pt is extremely nauseated- Zofran 4 mg IV given per [facility] Monday CRNA.  She states she threw up entire dose of this am's prep.  Cloudy brown results.  Enema given- pt tolerated 1/2 of solution only.  Cloudy brown liquid fluid returned

## 2018-08-31 NOTE — Patient Instructions (Signed)
Incomplete Colonoscopy due to poor prep  Dr Henrene Pastor recommends Virtual Colonoscopy and spoke w/ pt about this and will set up if patient consents   YOU HAD AN ENDOSCOPIC PROCEDURE TODAY AT [facility]:   Refer to the procedure report that was given to you for any specific questions about what was found during the examination.  If the procedure report does not answer your questions, please call your gastroenterologist to clarify.  If you requested that your care partner not be given the details of your procedure findings, then the procedure report has been included in a sealed envelope for you to review at your convenience later.  YOU SHOULD EXPECT: Some feelings of bloating in the abdomen. Passage of more gas than usual.  Walking can help get rid of the air that was put into your GI tract during the procedure and reduce the bloating. If you had a lower endoscopy (such as a colonoscopy or flexible sigmoidoscopy) you may notice spotting of blood in your stool or on the toilet paper. If you underwent a bowel prep for your procedure, you may not have a normal bowel movement for a few days.  Please Note:  You might notice some irritation and congestion in your nose or some drainage.  This is from the oxygen used during your procedure.  There is no need for concern and it should clear up in a day or so.  SYMPTOMS TO REPORT IMMEDIATELY:   Following lower endoscopy (colonoscopy or flexible sigmoidoscopy):  Excessive amounts of blood in the stool  Significant tenderness or worsening of abdominal pains  Swelling of the abdomen that is new, acute  Fever of 100F or higher    For urgent or emergent issues, a gastroenterologist can be reached at any hour by calling [phone].   DIET:  We do recommend a small meal at first, but then you may proceed to your regular diet.  Drink plenty of fluids but you should avoid alcoholic beverages for 24 hours.  ACTIVITY:  You should plan to  take it easy for the rest of today and you should NOT DRIVE or use heavy machinery until tomorrow (because of the sedation medicines used during the test).    FOLLOW UP: Our staff will call the number listed on your records 48-72 hours following your procedure to check on you and address any questions or concerns that you may have regarding the information given to you following your procedure. If we do not reach you, we will leave a message.  We will attempt to reach you two times.  During this call, we will ask if you have developed any symptoms of COVID 19. If you develop any symptoms (ie: fever, flu-like symptoms, shortness of breath, cough etc.) before then, please call [phone].  If you test positive for Covid 19 in the 2 weeks post procedure, please call and report this information to [place].    If any biopsies were taken you will be contacted by phone or by letter within the next 1-3 weeks.  Please call us at [phone] if you have not heard about the biopsies in 3 weeks.    SIGNATURES/CONFIDENTIALITY: You and/or your care partner have signed paperwork which will be entered into your electronic medical record.  These signatures attest to the fact that that the information above on your After Visit Summary has been reviewed and is understood.  Full responsibility of the confidentiality of this discharge information lies with you and/or  your care-partner. 

## 2018-09-01 ENCOUNTER — Telehealth: Payer: Self-pay | Admitting: Internal Medicine

## 2018-09-01 NOTE — Telephone Encounter (Signed)
Spoke with pts husband and she is not able to keep ginger ale or crackers down. States she took her pills but he is not sure if she kept those down at all. Instructed him to take her to the ER as she may get dehydrated and need IV fluids if she cannot keep anything down. He verbalized understanding.

## 2018-09-04 ENCOUNTER — Telehealth: Payer: Self-pay

## 2018-09-04 NOTE — Telephone Encounter (Signed)
  Follow up Call-  Call back number 08/31/2018  Post procedure Call Back phone  # [phone]  Permission to leave phone message Yes  Some recent data might be hidden     Patient questions:  Do you have a fever, pain , or abdominal swelling? No. Pain Score  0 *  Have you tolerated food without any problems? Yes.    Have you been able to return to your normal activities? No.  Do you have any questions about your discharge instructions: Diet   No. Medications  No. Follow up visit  No.  Do you have questions or concerns about your Care? Yes, patient said she has been throwing up for two days, she received fluids from her CRNA friend and is feeling much better. She is back to eating normal food, but is still a little weak. She is calling her primary Dr this am because her blood pressure is not right.  Actions: * If pain score is 4 or above: No action needed, pain <4.  1. Have you developed a fever since your procedure? no  2.   Have you had an respiratory symptoms (SOB or cough) since your procedure? no  3.   Have you tested positive for COVID 19 since your procedure no  4.   Have you had any family members/close contacts diagnosed with the COVID 19 since your procedure?  no   If yes to any of these questions please route to Joylene John, RN and Alphonsa Gin, Therapist, sports.

## 2018-09-04 NOTE — Telephone Encounter (Signed)
Noted  

## 2018-09-04 NOTE — Telephone Encounter (Signed)
-----   Message from Kathryn Shipper, MD sent at 09/04/2018  9:21 AM EDT ----- This needs to be documented in the forma [facility] l follow-up call section that we have for the [place] [facility] (not a staff message).  I will CC Lanelle Bal to show you where that is.  Transfer this note to that section and then sent back to me.  Thanks ----- Message ----- From: Phineas Semen, RN Sent: 09/04/2018   7:25 AM EDT To: Kathryn Shipper, MD  During post procedure follow up call, patient told me she had been throwing up for two days. She said her friend, that is a Immunologist , gave her some IV fluids and she is feeling much better. She was able to tolerate normal food yesterday , she is still a little weak. She said she is calling her family Dr this am because her BP is not right.

## 2018-09-04 NOTE — Telephone Encounter (Signed)
  Follow up Call-  Call back number 08/31/2018  Post procedure Call Back phone  # [phone]  Permission to leave phone message Yes  Some recent data might be hidden     Patient questions:  Do you have a fever, pain , or abdominal swelling? No. Pain Score  0 *  Have you tolerated food without any problems? Yes.    Have you been able to return to your normal activities? No.  Do you have any questions about your discharge instructions: Diet   No. Medications  No. Follow up visit  No.  Do you have questions or concerns about your Care? Patient says she was throwinf up after procedure and next day. Her friend who is CRNA gave her fluids, she is feeling much better. She is able to eat normal food, still a little weak. She said she is calling her family dr today bc her BP is not normal.  Actions: * If pain score is 4 or above: No action needed, pain <4. 1. Have you developed a fever since your procedure? no  2.   Have you had an respiratory symptoms (SOB or cough) since your procedure? no  3.   Have you tested positive for COVID 19 since your procedure no  4.   Have you had any family members/close contacts diagnosed with the COVID 19 since your procedure?  no   If yes to any of these questions please route to Joylene John, RN and Alphonsa Gin, Therapist, sports.

## 2018-09-04 NOTE — Telephone Encounter (Signed)
-----   Message from Irene Shipper, MD sent at 09/04/2018  9:21 AM EDT ----- This needs to be documented in the forma [facility] l follow-up call section that we have for the [place] (not a staff message).  I will CC Lanelle Bal to show you where that is.  Transfer this note to that section and then sent back to me.  Thanks ----- Message ----- From: Phineas Semen, RN Sent: 09/04/2018   7:25 AM EDT To: Irene Shipper, MD  During post procedure follow up call, patient told me she had been throwing up for two days. She said her friend, that is a Immunologist , gave her some IV fluids and she is feeling much better. She was able to tolerate normal food yesterday , she is still a little weak. She said she is calling her family Dr this am because her BP is not right.

## 2018-09-05 ENCOUNTER — Other Ambulatory Visit: Payer: Self-pay

## 2018-09-05 ENCOUNTER — Ambulatory Visit
Admission: RE | Admit: 2018-09-05 | Discharge: 2018-09-05 | Disposition: A | Discharge status: Home / Self Care | Payer: Medicare Other | Source: Ambulatory Visit | Attending: Orthopaedic Surgery | Admitting: Orthopaedic Surgery

## 2018-09-05 DIAGNOSIS — Z96642 Presence of left artificial hip joint: Secondary | ICD-10-CM

## 2018-09-06 ENCOUNTER — Encounter (HOSPITAL_COMMUNITY)
Admission: RE | Admit: 2018-09-06 | Discharge: 2018-09-06 | Disposition: A | Discharge status: Home / Self Care | Payer: Medicare Other | Source: Ambulatory Visit | Attending: Orthopaedic Surgery | Admitting: Orthopaedic Surgery

## 2018-09-06 DIAGNOSIS — Z96642 Presence of left artificial hip joint: Secondary | ICD-10-CM

## 2018-09-06 MED ORDER — TECHNETIUM TC 99M MEDRONATE IV KIT
20.0000 | PACK | Freq: Once | INTRAVENOUS | Status: AC | PRN
  Administered 2018-09-06: 20 via INTRAVENOUS

## 2018-09-07 ENCOUNTER — Encounter: Payer: Self-pay | Admitting: Orthopaedic Surgery

## 2018-09-07 ENCOUNTER — Ambulatory Visit (INDEPENDENT_AMBULATORY_CARE_PROVIDER_SITE_OTHER): Payer: Medicare Other | Admitting: Orthopaedic Surgery

## 2018-09-07 DIAGNOSIS — T84038D Mechanical loosening of other internal prosthetic joint, subsequent encounter: Secondary | ICD-10-CM

## 2018-09-07 DIAGNOSIS — Z96649 Presence of unspecified artificial hip joint: Secondary | ICD-10-CM | POA: Diagnosis not present

## 2018-09-07 DIAGNOSIS — Z96643 Presence of artificial hip joint, bilateral: Secondary | ICD-10-CM

## 2018-09-07 NOTE — Progress Notes (Signed)
The patient is a 73 year old female I am seeing in follow-up.  She has had both her hips replaced with the right hip being replaced first and then the left hip later.  This was in 2017.  This was also done in [facility].  She has since moved back up to [place] and lives near her daughter.  I do know her daughter Lattie Haw well.  She walks without assistive devices she has had left hip pain ever since the initial surgery.  Since it is been so long out from surgery, I felt was prudent at this point to obtain an MRI of her left hip as well as a three-phase bone scan to rule out prosthetic loosening.  She still denies any fever, chills, nausea, vomiting or illness.  It hurts in her groin on the left side and on the left lateral aspect of her hip.  She states that her right hip is never been problematic at all does not hurt.  On exam both hips move fluidly.  She is tender to compression around her left hip.  The rotation is full and both hips are stable.  MRI and three-phase bone scan are concerning for loosening of the left prosthesis.  There are signal changes on both the acetabular component and the femoral component and these are not seen on the right side.  We had a long thorough discussion about the possibility of revision hip surgery on the left side.  Both she and I are going to talk to her daughter in detail about this and come up with a treatment plan.  We will be in touch to discuss this further.  I did have a long thorough discussion about what revision surgery would involve including the risk and benefits of surgery and all the different treatment options if surgery is decided upon.  She knows this is for quality of life purposes and pain control purposes.  Of note she does not take anything for pain.  This is mainly mechanical type of pain that occurs with activities.

## 2018-12-20 ENCOUNTER — Other Ambulatory Visit: Payer: Self-pay

## 2018-12-20 ENCOUNTER — Telehealth: Payer: Self-pay

## 2018-12-20 DIAGNOSIS — Z800 Family history of malignant neoplasm of digestive organs: Secondary | ICD-10-CM

## 2018-12-20 DIAGNOSIS — Z86010 Personal history of colonic polyps: Secondary | ICD-10-CM

## 2018-12-20 NOTE — Telephone Encounter (Signed)
Pt scheduled for virtual colon at [facility] imaging [address] 01/04/19@8am , pt to arrive there at 7:40am. Pt to pick up prep at the same location prior to 01/01/19. Pt aware of appts.

## 2018-12-20 NOTE — Telephone Encounter (Signed)
-----   Message from Algernon Huxley, RN sent at 09/04/2018  9:13 AM EDT ----- Regarding: Virtual Colon Pt needs virtual colon if agreeable

## 2018-12-22 ENCOUNTER — Telehealth: Payer: Self-pay | Admitting: Internal Medicine

## 2018-12-22 NOTE — Telephone Encounter (Signed)
Discussed with pt that she would need to have colon done to have polyps removed if found on virtual colon.

## 2019-01-04 ENCOUNTER — Ambulatory Visit
Admission: RE | Admit: 2019-01-04 | Discharge: 2019-01-04 | Disposition: A | Discharge status: Home / Self Care | Payer: Medicare Other | Source: Ambulatory Visit | Attending: Internal Medicine | Admitting: Internal Medicine

## 2019-01-04 DIAGNOSIS — Z800 Family history of malignant neoplasm of digestive organs: Secondary | ICD-10-CM

## 2019-01-04 DIAGNOSIS — Z86010 Personal history of colonic polyps: Secondary | ICD-10-CM

## 2019-02-18 ENCOUNTER — Ambulatory Visit: Payer: Medicare PPO | Attending: Internal Medicine

## 2019-02-18 DIAGNOSIS — Z23 Encounter for immunization: Secondary | ICD-10-CM

## 2019-02-18 NOTE — Progress Notes (Signed)
   Covid-19 Vaccination Clinic  Name:  Kathryn Lee    MRN: [id number] DOB: [birth year]  02/18/2019  [name] was observed post Covid-19 immunization for 15 minutes without incidence. She was provided with Vaccine Information Sheet and instruction to access the V-Safe system.   [name] was instructed to call 911 with any severe reactions post vaccine: Marland Kitchen Difficulty breathing  . Swelling of your face and throat  . A fast heartbeat  . A bad rash all over your body  . Dizziness and weakness    Immunizations Administered    Name Date Dose VIS Date Route   Pfizer COVID-19 Vaccine 02/18/2019 10:51 AM 0.3 mL 12/15/2018 Intramuscular   Manufacturer: [place]   Lot: [id number]   [place]: [id number]

## 2019-03-13 ENCOUNTER — Ambulatory Visit: Payer: Medicare PPO | Attending: Internal Medicine

## 2019-03-13 DIAGNOSIS — Z23 Encounter for immunization: Secondary | ICD-10-CM | POA: Insufficient documentation

## 2019-03-13 NOTE — Progress Notes (Signed)
   Covid-19 Vaccination Clinic  Name:  Kathryn Lee    MRN: [id number] DOB: [birth year]  03/13/2019  [name] was observed post Covid-19 immunization for 15 minutes without incident. She was provided with Vaccine Information Sheet and instruction to access the V-Safe system.   [name] was instructed to call 911 with any severe reactions post vaccine: Marland Kitchen Difficulty breathing  . Swelling of face and throat  . A fast heartbeat  . A bad rash all over body  . Dizziness and weakness   Immunizations Administered    Name Date Dose VIS Date Route   Pfizer COVID-19 Vaccine 03/13/2019 12:17 PM 0.3 mL 12/15/2018 Intramuscular   Manufacturer: [place]   Lot: [id number]   [place]: [id number]

## 2019-07-25 DIAGNOSIS — L2089 Other atopic dermatitis: Secondary | ICD-10-CM | POA: Diagnosis not present

## 2019-07-25 DIAGNOSIS — L814 Other melanin hyperpigmentation: Secondary | ICD-10-CM | POA: Diagnosis not present

## 2019-07-25 DIAGNOSIS — D225 Melanocytic nevi of trunk: Secondary | ICD-10-CM | POA: Diagnosis not present

## 2019-07-25 DIAGNOSIS — L400 Psoriasis vulgaris: Secondary | ICD-10-CM | POA: Diagnosis not present

## 2019-07-25 DIAGNOSIS — L821 Other seborrheic keratosis: Secondary | ICD-10-CM | POA: Diagnosis not present

## 2019-07-25 DIAGNOSIS — D1801 Hemangioma of skin and subcutaneous tissue: Secondary | ICD-10-CM | POA: Diagnosis not present

## 2019-07-25 DIAGNOSIS — L280 Lichen simplex chronicus: Secondary | ICD-10-CM | POA: Diagnosis not present

## 2019-07-25 DIAGNOSIS — L309 Dermatitis, unspecified: Secondary | ICD-10-CM | POA: Diagnosis not present

## 2019-07-25 DIAGNOSIS — Z85828 Personal history of other malignant neoplasm of skin: Secondary | ICD-10-CM | POA: Diagnosis not present

## 2019-07-30 ENCOUNTER — Other Ambulatory Visit: Payer: Self-pay | Admitting: Internal Medicine

## 2019-07-30 DIAGNOSIS — Z1231 Encounter for screening mammogram for malignant neoplasm of breast: Secondary | ICD-10-CM

## 2019-08-01 DIAGNOSIS — H3561 Retinal hemorrhage, right eye: Secondary | ICD-10-CM | POA: Diagnosis not present

## 2019-08-17 ENCOUNTER — Ambulatory Visit
Admission: RE | Admit: 2019-08-17 | Discharge: 2019-08-17 | Disposition: A | Discharge status: Home / Self Care | Payer: Medicare PPO | Source: Ambulatory Visit | Attending: Internal Medicine | Admitting: Internal Medicine

## 2019-08-17 ENCOUNTER — Other Ambulatory Visit: Payer: Self-pay

## 2019-08-17 DIAGNOSIS — Z1231 Encounter for screening mammogram for malignant neoplasm of breast: Secondary | ICD-10-CM | POA: Diagnosis not present

## 2019-10-19 DIAGNOSIS — I10 Essential (primary) hypertension: Secondary | ICD-10-CM | POA: Diagnosis not present

## 2019-10-19 DIAGNOSIS — E785 Hyperlipidemia, unspecified: Secondary | ICD-10-CM | POA: Diagnosis not present

## 2019-10-26 DIAGNOSIS — F419 Anxiety disorder, unspecified: Secondary | ICD-10-CM | POA: Diagnosis not present

## 2019-10-26 DIAGNOSIS — R918 Other nonspecific abnormal finding of lung field: Secondary | ICD-10-CM | POA: Diagnosis not present

## 2019-10-26 DIAGNOSIS — M25552 Pain in left hip: Secondary | ICD-10-CM | POA: Diagnosis not present

## 2019-10-26 DIAGNOSIS — I10 Essential (primary) hypertension: Secondary | ICD-10-CM | POA: Diagnosis not present

## 2019-10-26 DIAGNOSIS — J309 Allergic rhinitis, unspecified: Secondary | ICD-10-CM | POA: Diagnosis not present

## 2019-10-26 DIAGNOSIS — R82998 Other abnormal findings in urine: Secondary | ICD-10-CM | POA: Diagnosis not present

## 2019-10-26 DIAGNOSIS — E785 Hyperlipidemia, unspecified: Secondary | ICD-10-CM | POA: Diagnosis not present

## 2019-10-26 DIAGNOSIS — Z0000 Encounter for general adult medical examination without abnormal findings: Secondary | ICD-10-CM | POA: Diagnosis not present

## 2019-10-26 DIAGNOSIS — K219 Gastro-esophageal reflux disease without esophagitis: Secondary | ICD-10-CM | POA: Diagnosis not present

## 2019-10-26 DIAGNOSIS — R21 Rash and other nonspecific skin eruption: Secondary | ICD-10-CM | POA: Diagnosis not present

## 2019-11-02 ENCOUNTER — Other Ambulatory Visit: Payer: Self-pay | Admitting: Internal Medicine

## 2019-11-02 DIAGNOSIS — R918 Other nonspecific abnormal finding of lung field: Secondary | ICD-10-CM

## 2019-11-16 ENCOUNTER — Other Ambulatory Visit: Payer: Self-pay

## 2019-11-16 ENCOUNTER — Ambulatory Visit
Admission: RE | Admit: 2019-11-16 | Discharge: 2019-11-16 | Disposition: A | Discharge status: Home / Self Care | Payer: Medicare PPO | Source: Ambulatory Visit | Attending: Internal Medicine | Admitting: Internal Medicine

## 2019-11-16 DIAGNOSIS — R918 Other nonspecific abnormal finding of lung field: Secondary | ICD-10-CM

## 2019-11-16 DIAGNOSIS — I700 Atherosclerosis of aorta: Secondary | ICD-10-CM | POA: Diagnosis not present

## 2019-11-16 DIAGNOSIS — I2510 Atherosclerotic heart disease of native coronary artery without angina pectoris: Secondary | ICD-10-CM | POA: Diagnosis not present

## 2019-11-28 DIAGNOSIS — Z1212 Encounter for screening for malignant neoplasm of rectum: Secondary | ICD-10-CM | POA: Diagnosis not present

## 2020-03-11 DIAGNOSIS — R21 Rash and other nonspecific skin eruption: Secondary | ICD-10-CM | POA: Diagnosis not present

## 2020-03-11 DIAGNOSIS — I10 Essential (primary) hypertension: Secondary | ICD-10-CM | POA: Diagnosis not present

## 2020-03-21 DIAGNOSIS — L280 Lichen simplex chronicus: Secondary | ICD-10-CM | POA: Diagnosis not present

## 2020-04-08 DIAGNOSIS — L820 Inflamed seborrheic keratosis: Secondary | ICD-10-CM | POA: Diagnosis not present

## 2020-04-08 DIAGNOSIS — L280 Lichen simplex chronicus: Secondary | ICD-10-CM | POA: Diagnosis not present

## 2020-04-08 DIAGNOSIS — L308 Other specified dermatitis: Secondary | ICD-10-CM | POA: Diagnosis not present

## 2020-05-05 DIAGNOSIS — H43813 Vitreous degeneration, bilateral: Secondary | ICD-10-CM | POA: Diagnosis not present

## 2020-05-08 DIAGNOSIS — L280 Lichen simplex chronicus: Secondary | ICD-10-CM | POA: Diagnosis not present

## 2020-07-25 ENCOUNTER — Other Ambulatory Visit: Payer: Self-pay | Admitting: Internal Medicine

## 2020-07-25 DIAGNOSIS — Z1231 Encounter for screening mammogram for malignant neoplasm of breast: Secondary | ICD-10-CM

## 2020-08-06 DIAGNOSIS — M25552 Pain in left hip: Secondary | ICD-10-CM | POA: Diagnosis not present

## 2020-08-06 DIAGNOSIS — M25551 Pain in right hip: Secondary | ICD-10-CM | POA: Diagnosis not present

## 2020-09-09 ENCOUNTER — Other Ambulatory Visit (HOSPITAL_COMMUNITY): Payer: Self-pay | Admitting: Orthopaedic Surgery

## 2020-09-09 ENCOUNTER — Other Ambulatory Visit: Payer: Self-pay | Admitting: Orthopaedic Surgery

## 2020-09-09 DIAGNOSIS — Z96643 Presence of artificial hip joint, bilateral: Secondary | ICD-10-CM

## 2020-09-17 ENCOUNTER — Other Ambulatory Visit: Payer: Self-pay

## 2020-09-17 ENCOUNTER — Ambulatory Visit
Admission: RE | Admit: 2020-09-17 | Discharge: 2020-09-17 | Disposition: A | Discharge status: Home / Self Care | Payer: Medicare PPO | Source: Ambulatory Visit | Attending: Internal Medicine | Admitting: Internal Medicine

## 2020-09-17 DIAGNOSIS — Z1231 Encounter for screening mammogram for malignant neoplasm of breast: Secondary | ICD-10-CM | POA: Diagnosis not present

## 2020-09-18 ENCOUNTER — Encounter (HOSPITAL_COMMUNITY)
Admission: RE | Admit: 2020-09-18 | Discharge: 2020-09-18 | Disposition: A | Discharge status: Home / Self Care | Payer: Medicare PPO | Source: Ambulatory Visit | Attending: Orthopaedic Surgery | Admitting: Orthopaedic Surgery

## 2020-09-18 DIAGNOSIS — M25552 Pain in left hip: Secondary | ICD-10-CM | POA: Diagnosis not present

## 2020-09-18 DIAGNOSIS — Z96643 Presence of artificial hip joint, bilateral: Secondary | ICD-10-CM | POA: Diagnosis not present

## 2020-09-18 DIAGNOSIS — Z471 Aftercare following joint replacement surgery: Secondary | ICD-10-CM | POA: Diagnosis not present

## 2020-09-18 DIAGNOSIS — M25551 Pain in right hip: Secondary | ICD-10-CM | POA: Diagnosis not present

## 2020-09-18 MED ORDER — TECHNETIUM TC 99M MEDRONATE IV KIT
20.0000 | PACK | Freq: Once | INTRAVENOUS | Status: AC | PRN
  Administered 2020-09-18: 21.1 via INTRAVENOUS

## 2020-11-13 DIAGNOSIS — E785 Hyperlipidemia, unspecified: Secondary | ICD-10-CM | POA: Diagnosis not present

## 2020-11-13 DIAGNOSIS — I10 Essential (primary) hypertension: Secondary | ICD-10-CM | POA: Diagnosis not present

## 2020-11-20 DIAGNOSIS — I700 Atherosclerosis of aorta: Secondary | ICD-10-CM | POA: Diagnosis not present

## 2020-11-20 DIAGNOSIS — Z0000 Encounter for general adult medical examination without abnormal findings: Secondary | ICD-10-CM | POA: Diagnosis not present

## 2020-11-20 DIAGNOSIS — Z1331 Encounter for screening for depression: Secondary | ICD-10-CM | POA: Diagnosis not present

## 2020-11-20 DIAGNOSIS — M25552 Pain in left hip: Secondary | ICD-10-CM | POA: Diagnosis not present

## 2020-11-20 DIAGNOSIS — E785 Hyperlipidemia, unspecified: Secondary | ICD-10-CM | POA: Diagnosis not present

## 2020-11-20 DIAGNOSIS — Z1339 Encounter for screening examination for other mental health and behavioral disorders: Secondary | ICD-10-CM | POA: Diagnosis not present

## 2020-11-20 DIAGNOSIS — I2510 Atherosclerotic heart disease of native coronary artery without angina pectoris: Secondary | ICD-10-CM | POA: Diagnosis not present

## 2020-11-20 DIAGNOSIS — R82998 Other abnormal findings in urine: Secondary | ICD-10-CM | POA: Diagnosis not present

## 2020-11-20 DIAGNOSIS — R911 Solitary pulmonary nodule: Secondary | ICD-10-CM | POA: Diagnosis not present

## 2020-11-20 DIAGNOSIS — Z1212 Encounter for screening for malignant neoplasm of rectum: Secondary | ICD-10-CM | POA: Diagnosis not present

## 2020-11-20 DIAGNOSIS — I10 Essential (primary) hypertension: Secondary | ICD-10-CM | POA: Diagnosis not present

## 2020-11-21 ENCOUNTER — Other Ambulatory Visit: Payer: Self-pay | Admitting: Hematology & Oncology

## 2020-11-21 DIAGNOSIS — R911 Solitary pulmonary nodule: Secondary | ICD-10-CM

## 2020-12-18 ENCOUNTER — Telehealth: Payer: Medicare PPO | Admitting: Physician Assistant

## 2020-12-18 DIAGNOSIS — R6889 Other general symptoms and signs: Secondary | ICD-10-CM | POA: Diagnosis not present

## 2020-12-18 DIAGNOSIS — Z20828 Contact with and (suspected) exposure to other viral communicable diseases: Secondary | ICD-10-CM | POA: Diagnosis not present

## 2020-12-18 MED ORDER — OSELTAMIVIR PHOSPHATE 75 MG PO CAPS: 75.0000 mg | ORAL_CAPSULE | Freq: Two times a day (BID) | ORAL | 0 refills | Status: DC

## 2020-12-18 NOTE — Progress Notes (Signed)

## 2020-12-18 NOTE — Progress Notes (Signed)
I have spent 5 minutes in review of e-visit questionnaire, review and updating patient chart, medical decision making and response to patient.   [place] Cody Lyndel Dancel, PA-C    

## 2020-12-18 NOTE — Progress Notes (Signed)
Message sent to patient requesting further input regarding current symptoms. Awaiting patient response.  

## 2020-12-21 NOTE — Progress Notes (Incomplete)
Cardiology Office Note:    Date:  12/21/2020   ID:  Milagro, Belmares [birth year], MRN [id number]  PCP:  Leanna Battles, MD  Cardiologist:  None   Referring MD: Leanna Battles, MD   No chief complaint on file.   History of Present Illness:    Kathryn Lee is a 75 y.o. female with a hx of ***  Past Medical History:  Diagnosis Date   Arthritis    Blurred vision, bilateral    CAD (coronary artery disease)    Cancer (HCC)    basal cell   Depression    Heart murmur    as a baby   Hyperlipidemia    Hypertension    Lung nodule    Osteoarthritis    Seasonal allergic rhinitis     Past Surgical History:  Procedure Laterality Date   ABDOMINAL HYSTERECTOMY     CATARACT EXTRACTION     COLONOSCOPY     REDUCTION MAMMAPLASTY Bilateral 2004   SQUAMOUS CELL CARCINOMA EXCISION     TOTAL HIP ARTHROPLASTY      Current Medications: No outpatient medications have been marked as taking for the 12/22/20 encounter (Appointment) with Belva Crome, MD.     Allergies:   Codeine and Morphine   Social History   Socioeconomic History   Marital status: Married    Spouse name: Not on file   Number of children: Not on file   Years of education: Not on file   Highest education level: Not on file  Occupational History   Not on file  Tobacco Use   Smoking status: Never   Smokeless tobacco: Never  Vaping Use   Vaping Use: Never used  Substance and Sexual Activity   Alcohol use: Yes    Comment: daily   Drug use: Never   Sexual activity: Not on file  Other Topics Concern   Not on file  Social History Narrative   Not on file   Social Determinants of Health   Financial Resource Strain: Not on file  Food Insecurity: Not on file  Transportation Needs: Not on file  Physical Activity: Not on file  Stress: Not on file  Social Connections: Not on file     Family History: The patient's family history includes Alzheimer's disease in her mother; Colon cancer  (age of onset: 59) in her mother; Colon cancer (age of onset: 46) in her father; Liver cancer in her father. There is no history of Esophageal cancer, Rectal cancer, or Stomach cancer.  ROS:   Please see the history of present illness.    *** All other systems reviewed and are negative.  EKGs/Labs/Other Studies Reviewed:    The following studies were reviewed today: Chest CT 2021: IMPRESSION: Bibasilar pulmonary nodules, indeterminate, but stable since prior CT examination of 01/04/2019. Non-contrast chest at 18-24 months (from the original scan) is considered optional for low-risk patients, but is recommended for high-risk patients. This recommendation follows the consensus statement: Guidelines for Management of Incidental Pulmonary Nodules Detected on CT Images: From the Fleischner Society 2017; Radiology 2017; 284:228-243.   Moderate coronary artery calcification   Aortic Atherosclerosis (ICD10-I70.0).  EKG:  EKG ***  Recent Labs: No results found for requested labs within last 8760 hours.  Recent Lipid Panel    Component Value Date/Time   CHOL 172 07/26/2008 1024   TRIG 95.0 07/26/2008 1024   HDL 72.50 07/26/2008 1024   CHOLHDL 2 07/26/2008 1024   VLDL 19.0 07/26/2008 1024  [employer] 81 07/26/2008 1024    Physical Exam:    VS:  There were no vitals taken for this visit.    Wt Readings from Last 3 Encounters:  08/31/18 171 lb (77.6 kg)  07/06/18 171 lb (77.6 kg)     GEN: ***. No acute distress HEENT: Normal NECK: No JVD. LYMPHATICS: No lymphadenopathy CARDIAC: *** murmur. RRR *** gallop, or edema. VASCULAR: *** Normal Pulses. No bruits. RESPIRATORY:  Clear to auscultation without rales, wheezing or rhonchi  ABDOMEN: Soft, non-tender, non-distended, No pulsatile mass, MUSCULOSKELETAL: No deformity  SKIN: Warm and dry NEUROLOGIC:  Alert and oriented x 3 PSYCHIATRIC:  Normal affect   ASSESSMENT:    1. Coronary artery calcification seen on CAT scan   2.  Essential hypertension   3. Hyperlipidemia LDL goal <70    PLAN:    In order of problems listed above:  ***   Medication Adjustments/Labs and Tests Ordered: Current medicines are reviewed at length with the patient today.  Concerns regarding medicines are outlined above.  No orders of the defined types were placed in this encounter.  No orders of the defined types were placed in this encounter.   There are no Patient Instructions on file for this visit.   Signed, Sinclair Grooms, MD  12/21/2020 10:07 AM    [place]

## 2020-12-22 ENCOUNTER — Other Ambulatory Visit: Payer: Self-pay

## 2020-12-22 ENCOUNTER — Encounter: Payer: Self-pay | Admitting: Interventional Cardiology

## 2020-12-22 ENCOUNTER — Ambulatory Visit: Payer: Medicare PPO | Admitting: Interventional Cardiology

## 2020-12-22 VITALS — BP 118/70 | HR 6 | Ht 66.0 in | Wt 167.0 lb

## 2020-12-22 DIAGNOSIS — I2510 Atherosclerotic heart disease of native coronary artery without angina pectoris: Secondary | ICD-10-CM | POA: Diagnosis not present

## 2020-12-22 DIAGNOSIS — I10 Essential (primary) hypertension: Secondary | ICD-10-CM | POA: Diagnosis not present

## 2020-12-22 DIAGNOSIS — E785 Hyperlipidemia, unspecified: Secondary | ICD-10-CM

## 2020-12-22 NOTE — Patient Instructions (Signed)
Medication Instructions:  Your physician recommends that you continue on your current medications as directed. Please refer to the Current Medication list given to you today.  *If you need a refill on your cardiac medications before your next appointment, please call your pharmacy*   Lab Work: HgA1c today If you have labs (blood work) drawn today and your tests are completely normal, you will receive your results only by: [place] (if you have MyChart) OR A paper copy in the mail If you have any lab test that is abnormal or we need to change your treatment, we will call you to review the results.   Testing/Procedures: None    Follow-Up: At [facility], you and your health needs are our priority.  As part of our continuing mission to provide you with exceptional heart care, we have created designated Provider Care Teams.  These Care Teams include your primary Cardiologist (physician) and Advanced Practice Providers (APPs -  Physician Assistants and Nurse Practitioners) who all work together to provide you with the care you need, when you need it.  We recommend signing up for the patient portal called "MyChart".  Sign up information is provided on this After Visit Summary.  MyChart is used to connect with patients for Virtual Visits (Telemedicine).  Patients are able to view lab/test results, encounter notes, upcoming appointments, etc.  Non-urgent messages can be sent to your provider as well.   To learn more about what you can do with MyChart, go to [email or web address].    Your next appointment:   As needed     The format for your next appointment:   In Person  Provider:   Dr. Daneen Schick  :1}    Other Instructions

## 2020-12-22 NOTE — Progress Notes (Signed)
Cardiology Office Note:    Date:  12/22/2020   ID:  Kathryn Lee, Kathryn Lee 06/27/1945, MRN [id number]  PCP:  Leanna Battles, MD  Cardiologist:  None   Referring MD: Leanna Battles, MD   Chief Complaint  Patient presents with   Coronary Artery Disease    History of Present Illness:    Kathryn Lee is a 75 y.o. female with a hx of primary hypertension, hyperlipidemia, and recent discovery of coronary artery calcification   The patient is active.  She walks 2 miles every day related to walking her dog.  She pushes her lawn more manually, which is not self-propelled.  She is not limited at all in physical activity.  Denies chest discomfort of any variety.  No prior history of heart disease.  Maternal and paternal family history of heart attack in her grandparents.  Her maternal grandmother also had CVA.  The patient has never been a smoker.  She is not known to be diabetic.  She does have hypertension and hyperlipidemia which have been appropriately treated over time.  Past Medical History:  Diagnosis Date   Arthritis    Blurred vision, bilateral    CAD (coronary artery disease)    Cancer (HCC)    basal cell   Depression    Heart murmur    as a baby   Hyperlipidemia    Hypertension    Lung nodule    Osteoarthritis    Seasonal allergic rhinitis     Past Surgical History:  Procedure Laterality Date   ABDOMINAL HYSTERECTOMY     CATARACT EXTRACTION     COLONOSCOPY     REDUCTION MAMMAPLASTY Bilateral 2004   SQUAMOUS CELL CARCINOMA EXCISION     TOTAL HIP ARTHROPLASTY      Current Medications: Current Meds  Medication Sig   aspirin EC 81 MG tablet Take 81 mg by mouth daily.   escitalopram (LEXAPRO) 10 MG tablet Take 10 mg by mouth daily.   ezetimibe (ZETIA) 10 MG tablet Take 1 tablet by mouth daily.   fluticasone (FLONASE) 50 MCG/ACT nasal spray as needed.   folic acid (FOLVITE) 329 MCG tablet Take 400 mcg by mouth daily.   ibuprofen (ADVIL) 400 MG  tablet Take 400 mg by mouth every 6 (six) hours as needed.   LORazepam (ATIVAN) 0.5 MG tablet Take 0.5 mg by mouth at bedtime.   ondansetron (ZOFRAN) 4 MG tablet Take 1 tablet 20 minutes before drinking each half of the colonoscopy prep   oseltamivir (TAMIFLU) 75 MG capsule Take 1 capsule (75 mg total) by mouth 2 (two) times daily.   Potassium Gluconate 80 MG TABS Take by mouth.   simvastatin (ZOCOR) 40 MG tablet Take 40 mg by mouth daily.   telmisartan-hydrochlorothiazide (MICARDIS HCT) 80-12.5 MG tablet Take 1 tablet by mouth daily.   triamcinolone cream (KENALOG) 0.1 % Apply 1 application topically 2 (two) times daily.     Allergies:   Codeine and Morphine   Social History   Socioeconomic History   Marital status: Married    Spouse name: Not on file   Number of children: Not on file   Years of education: Not on file   Highest education level: Not on file  Occupational History   Not on file  Tobacco Use   Smoking status: Never   Smokeless tobacco: Never  Vaping Use   Vaping Use: Never used  Substance and Sexual Activity   Alcohol use: Yes    Comment: daily  Drug use: Never   Sexual activity: Not on file  Other Topics Concern   Not on file  Social History Narrative   Not on file   Social Determinants of Health   Financial Resource Strain: Not on file  Food Insecurity: Not on file  Transportation Needs: Not on file  Physical Activity: Not on file  Stress: Not on file  Social Connections: Not on file     Family History: The patient's family history includes Alzheimer's disease in her mother; Colon cancer (age of onset: 36) in her mother; Colon cancer (age of onset: 54) in her father; Liver cancer in her father. There is no history of Esophageal cancer, Rectal cancer, or Stomach cancer.  ROS:   Please see the history of present illness.    She has recurrent colon polyps.  She is active and woodworking.  All other systems reviewed and are negative.  EKGs/Labs/Other  Studies Reviewed:    The following studies were reviewed today:  Chest CT without contrast November 2021: IMPRESSION: Bibasilar pulmonary nodules, indeterminate, but stable since prior CT examination of 01/04/2019. Non-contrast chest at 18-24 months (from the original scan) is considered optional for low-risk patients, but is recommended for high-risk patients. This recommendation follows the consensus statement: Guidelines for Management of Incidental Pulmonary Nodules Detected on CT Images: From the Fleischner Society 2017; Radiology 2017; 284:228-243.   Moderate coronary artery calcification   Aortic Atherosclerosis (ICD10-I70.0).   Virtual colonoscopy 01/04/2019: IMPRESSION: No fixed polypoid filling defects or annular constricting lesions within the colon. Scattered colonic diverticulosis.   Small nodules in the lower lobes bilaterally, the largest 7 mm. Non-contrast chest CT at 3-6 months is recommended. If the nodules are stable at time of repeat CT, then future CT at 18-24 months (from today's scan) is considered optional for low-risk patients, but is recommended for high-risk patients. This recommendation follows the consensus statement: Guidelines for Management of Incidental Pulmonary Nodules Detected on CT Images: From the Fleischner Society 2017; Radiology 2017; 284:228-243.   Small hiatal hernia.   Aortoiliac atherosclerosis.    EKG:  EKG normal sinus rhythm, normal PR interval, tracing.  Recent Labs: No results found for requested labs within last 8760 hours.  Recent Lipid Panel    Component Value Date/Time   CHOL 172 07/26/2008 1024   TRIG 95.0 07/26/2008 1024   HDL 72.50 07/26/2008 1024   CHOLHDL 2 07/26/2008 1024   VLDL 19.0 07/26/2008 1024   LDLCALC 81 07/26/2008 1024    Physical Exam:    VS:  BP 118/70    Pulse (!) 6    Ht 5\' 6"  (1.676 m)    Wt 167 lb (75.8 kg)    SpO2 97%    BMI 26.95 kg/m     Wt Readings from Last 3 Encounters:   12/22/20 167 lb (75.8 kg)  08/31/18 171 lb (77.6 kg)  07/06/18 171 lb (77.6 kg)     GEN: Healthy appearing, gray hair, and compensated. No acute distress HEENT: Normal NECK: No JVD. LYMPHATICS: No lymphadenopathy CARDIAC: No murmur. RRR no gallop, or edema. VASCULAR:  Normal Pulses. No bruits. RESPIRATORY:  Clear to auscultation without rales, wheezing or rhonchi  ABDOMEN: Soft, non-tender, non-distended, No pulsatile mass, MUSCULOSKELETAL: No deformity  SKIN: Warm and dry NEUROLOGIC:  Alert and oriented x 3 PSYCHIATRIC:  Normal affect   ASSESSMENT:    1. Coronary artery calcification seen on CAT scan   2. Essential hypertension   3. Hyperlipidemia LDL goal <70  PLAN:    In order of problems listed above:  Risk factor modification/management is already being done in an excellent manner with blood pressure, lipids, and physical activity all reaching the appropriate metric.  We will check a hemoglobin X5E to be certain that there is not a signal towards prediabetes.  She does not smoke, has she sleeps well.  Agree with continuing aspirin daily.  In absence of symptoms, I do not believe ischemic evaluation is indicated.  We should simply continue aggressive control of risk factors. Target blood pressure 130/80 mmHg.  Continue Micardis HCT 80/12.5 mg/day. Continue simvastatin and Zetia.  Most recent LDL was 72.  Overall education and awareness concerning primary risk prevention was discussed in detail: LDL less than 70, hemoglobin A1c less than 7, blood pressure target less than 130/80 mmHg, >150 minutes of moderate aerobic activity per week, avoidance of smoking, weight control (via diet and exercise), and continued surveillance/management of/for obstructive sleep apnea.    Medication Adjustments/Labs and Tests Ordered: Current medicines are reviewed at length with the patient today.  Concerns regarding medicines are outlined above.  No orders of the defined types were placed  in this encounter.  No orders of the defined types were placed in this encounter.   There are no Patient Instructions on file for this visit.   Signed, Sinclair Grooms, MD  12/22/2020 12:03 PM    [place]

## 2020-12-23 LAB — HEMOGLOBIN A1C
Est. average glucose Bld gHb Est-mCnc: 120 mg/dL
Hgb A1c MFr Bld: 5.8 % — ABNORMAL HIGH (ref 4.8–5.6)

## 2021-02-24 DIAGNOSIS — M542 Cervicalgia: Secondary | ICD-10-CM | POA: Diagnosis not present

## 2021-02-27 DIAGNOSIS — M542 Cervicalgia: Secondary | ICD-10-CM | POA: Diagnosis not present

## 2021-03-06 DIAGNOSIS — M542 Cervicalgia: Secondary | ICD-10-CM | POA: Diagnosis not present

## 2021-03-09 DIAGNOSIS — M542 Cervicalgia: Secondary | ICD-10-CM | POA: Diagnosis not present

## 2021-03-13 DIAGNOSIS — M542 Cervicalgia: Secondary | ICD-10-CM | POA: Diagnosis not present

## 2021-03-17 DIAGNOSIS — M542 Cervicalgia: Secondary | ICD-10-CM | POA: Diagnosis not present

## 2021-03-20 DIAGNOSIS — M542 Cervicalgia: Secondary | ICD-10-CM | POA: Diagnosis not present

## 2021-03-24 DIAGNOSIS — M542 Cervicalgia: Secondary | ICD-10-CM | POA: Diagnosis not present

## 2021-03-27 DIAGNOSIS — L814 Other melanin hyperpigmentation: Secondary | ICD-10-CM | POA: Diagnosis not present

## 2021-03-27 DIAGNOSIS — L570 Actinic keratosis: Secondary | ICD-10-CM | POA: Diagnosis not present

## 2021-03-27 DIAGNOSIS — M542 Cervicalgia: Secondary | ICD-10-CM | POA: Diagnosis not present

## 2021-03-27 DIAGNOSIS — D1801 Hemangioma of skin and subcutaneous tissue: Secondary | ICD-10-CM | POA: Diagnosis not present

## 2021-03-27 DIAGNOSIS — Z23 Encounter for immunization: Secondary | ICD-10-CM | POA: Diagnosis not present

## 2021-03-27 DIAGNOSIS — L308 Other specified dermatitis: Secondary | ICD-10-CM | POA: Diagnosis not present

## 2021-03-27 DIAGNOSIS — L578 Other skin changes due to chronic exposure to nonionizing radiation: Secondary | ICD-10-CM | POA: Diagnosis not present

## 2021-03-27 DIAGNOSIS — L309 Dermatitis, unspecified: Secondary | ICD-10-CM | POA: Diagnosis not present

## 2021-03-27 DIAGNOSIS — L821 Other seborrheic keratosis: Secondary | ICD-10-CM | POA: Diagnosis not present

## 2021-04-10 DIAGNOSIS — L821 Other seborrheic keratosis: Secondary | ICD-10-CM | POA: Diagnosis not present

## 2021-04-10 DIAGNOSIS — L209 Atopic dermatitis, unspecified: Secondary | ICD-10-CM | POA: Diagnosis not present

## 2021-05-26 DIAGNOSIS — H1789 Other corneal scars and opacities: Secondary | ICD-10-CM | POA: Diagnosis not present

## 2021-05-26 DIAGNOSIS — Z961 Presence of intraocular lens: Secondary | ICD-10-CM | POA: Diagnosis not present

## 2021-05-26 DIAGNOSIS — H52223 Regular astigmatism, bilateral: Secondary | ICD-10-CM | POA: Diagnosis not present

## 2021-05-26 DIAGNOSIS — H35342 Macular cyst, hole, or pseudohole, left eye: Secondary | ICD-10-CM | POA: Diagnosis not present

## 2021-05-26 DIAGNOSIS — H5203 Hypermetropia, bilateral: Secondary | ICD-10-CM | POA: Diagnosis not present

## 2021-05-26 DIAGNOSIS — H524 Presbyopia: Secondary | ICD-10-CM | POA: Diagnosis not present

## 2021-05-26 DIAGNOSIS — H43813 Vitreous degeneration, bilateral: Secondary | ICD-10-CM | POA: Diagnosis not present

## 2021-06-11 DIAGNOSIS — L853 Xerosis cutis: Secondary | ICD-10-CM | POA: Diagnosis not present

## 2021-06-11 DIAGNOSIS — L209 Atopic dermatitis, unspecified: Secondary | ICD-10-CM | POA: Diagnosis not present

## 2021-08-04 ENCOUNTER — Other Ambulatory Visit: Payer: Self-pay | Admitting: Internal Medicine

## 2021-08-04 DIAGNOSIS — Z1231 Encounter for screening mammogram for malignant neoplasm of breast: Secondary | ICD-10-CM

## 2021-09-18 ENCOUNTER — Ambulatory Visit
Admission: RE | Admit: 2021-09-18 | Discharge: 2021-09-18 | Disposition: A | Discharge status: Home / Self Care | Payer: Medicare PPO | Source: Ambulatory Visit | Attending: Internal Medicine | Admitting: Internal Medicine

## 2021-09-18 DIAGNOSIS — Z1231 Encounter for screening mammogram for malignant neoplasm of breast: Secondary | ICD-10-CM | POA: Diagnosis not present

## 2021-09-30 DIAGNOSIS — I2510 Atherosclerotic heart disease of native coronary artery without angina pectoris: Secondary | ICD-10-CM | POA: Diagnosis not present

## 2021-09-30 DIAGNOSIS — K219 Gastro-esophageal reflux disease without esophagitis: Secondary | ICD-10-CM | POA: Diagnosis not present

## 2021-09-30 DIAGNOSIS — R5383 Other fatigue: Secondary | ICD-10-CM | POA: Diagnosis not present

## 2021-09-30 DIAGNOSIS — E785 Hyperlipidemia, unspecified: Secondary | ICD-10-CM | POA: Diagnosis not present

## 2021-09-30 DIAGNOSIS — I10 Essential (primary) hypertension: Secondary | ICD-10-CM | POA: Diagnosis not present

## 2021-12-17 DIAGNOSIS — L209 Atopic dermatitis, unspecified: Secondary | ICD-10-CM | POA: Diagnosis not present

## 2022-01-11 DIAGNOSIS — F419 Anxiety disorder, unspecified: Secondary | ICD-10-CM | POA: Diagnosis not present

## 2022-01-11 DIAGNOSIS — E785 Hyperlipidemia, unspecified: Secondary | ICD-10-CM | POA: Diagnosis not present

## 2022-01-11 DIAGNOSIS — R7989 Other specified abnormal findings of blood chemistry: Secondary | ICD-10-CM | POA: Diagnosis not present

## 2022-01-11 DIAGNOSIS — I10 Essential (primary) hypertension: Secondary | ICD-10-CM | POA: Diagnosis not present

## 2022-01-14 DIAGNOSIS — Z1212 Encounter for screening for malignant neoplasm of rectum: Secondary | ICD-10-CM | POA: Diagnosis not present

## 2022-01-18 DIAGNOSIS — I10 Essential (primary) hypertension: Secondary | ICD-10-CM | POA: Diagnosis not present

## 2022-01-18 DIAGNOSIS — Z0000 Encounter for general adult medical examination without abnormal findings: Secondary | ICD-10-CM | POA: Diagnosis not present

## 2022-01-18 DIAGNOSIS — I2510 Atherosclerotic heart disease of native coronary artery without angina pectoris: Secondary | ICD-10-CM | POA: Diagnosis not present

## 2022-01-18 DIAGNOSIS — K219 Gastro-esophageal reflux disease without esophagitis: Secondary | ICD-10-CM | POA: Diagnosis not present

## 2022-01-18 DIAGNOSIS — I700 Atherosclerosis of aorta: Secondary | ICD-10-CM | POA: Diagnosis not present

## 2022-01-18 DIAGNOSIS — R82998 Other abnormal findings in urine: Secondary | ICD-10-CM | POA: Diagnosis not present

## 2022-01-18 DIAGNOSIS — F419 Anxiety disorder, unspecified: Secondary | ICD-10-CM | POA: Diagnosis not present

## 2022-01-18 DIAGNOSIS — E785 Hyperlipidemia, unspecified: Secondary | ICD-10-CM | POA: Diagnosis not present

## 2022-03-23 IMAGING — CT CT CHEST W/O CM
1 series · 15 of 34 positions shown, 19 images · non-contrast
Comparison: None.

CLINICAL DATA: Bilateral pulmonary nodules

EXAM:
CT CHEST WITHOUT CONTRAST
TECHNIQUE: Multidetector CT imaging of the chest was performed following the
standard protocol without IV contrast.

[Series 2: chest w/(date) · axial · 0.80mm/px · z∈[-332,-24]mm · 15 of 182 slices shown, 19 images]
[im 14/182  mediastinal]
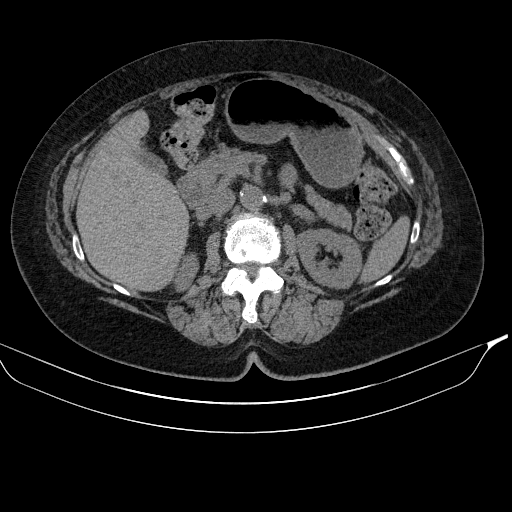
[im 14/182  lung]
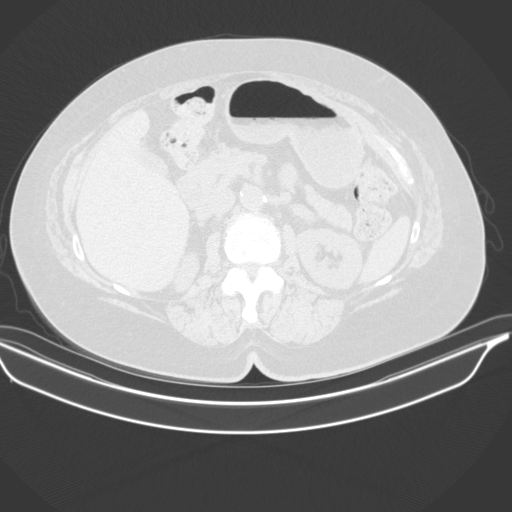
[im 27/182  lung]
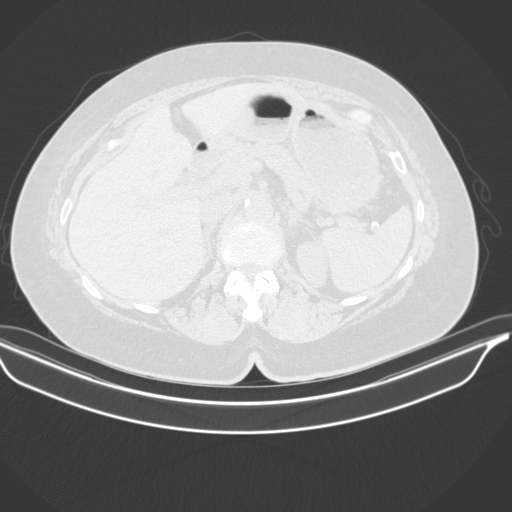
[im 37/182  lung]
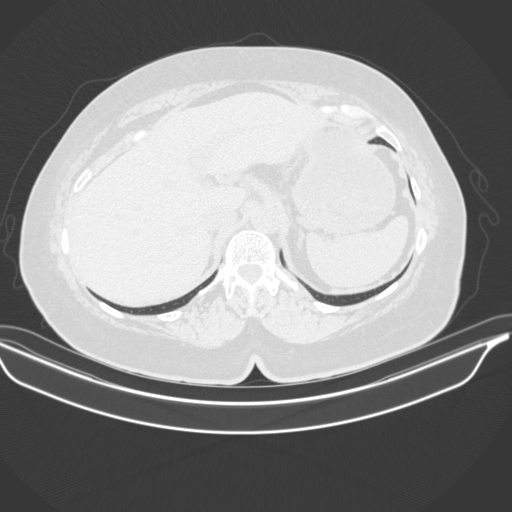
[im 47/182  lung]
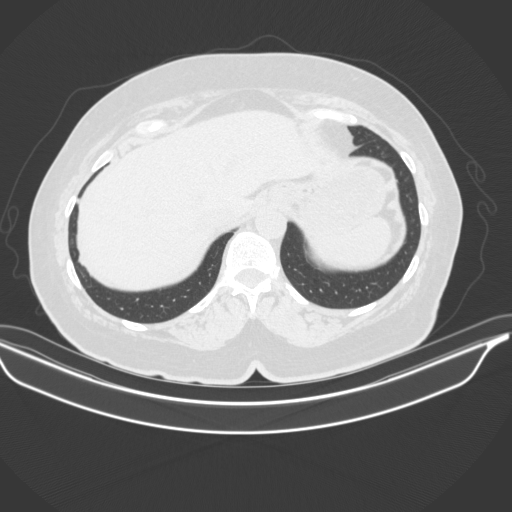
[im 61/182  mediastinal]
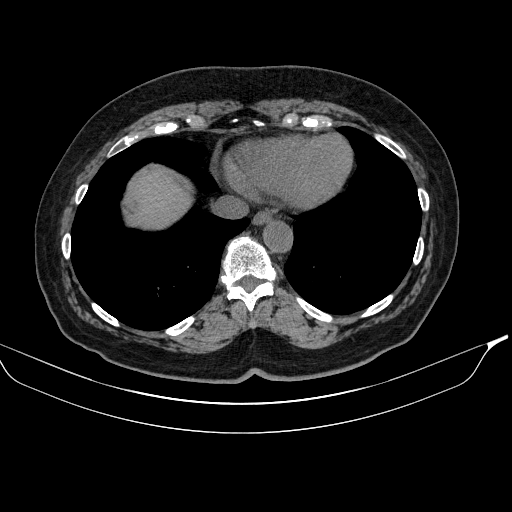
[im 61/182  lung]
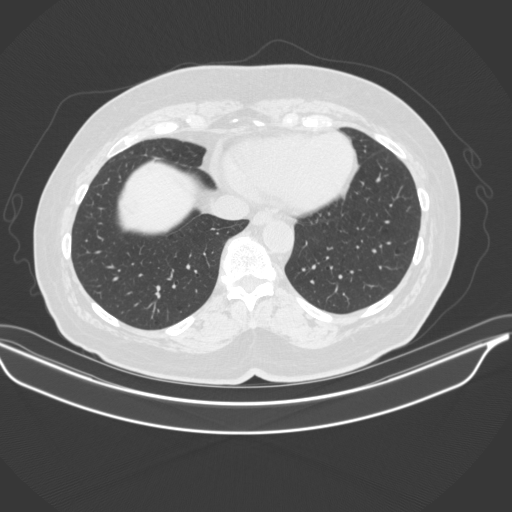
[im 73/182  lung]
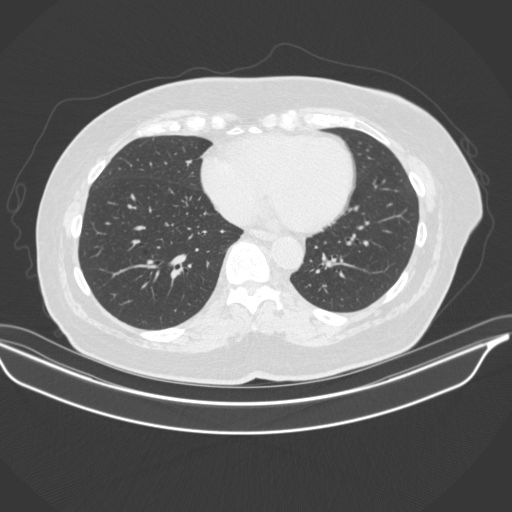
[im 81/182  lung]
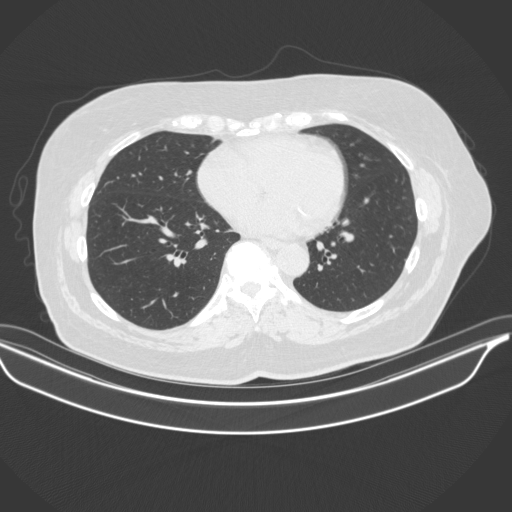
[im 94/182  lung]
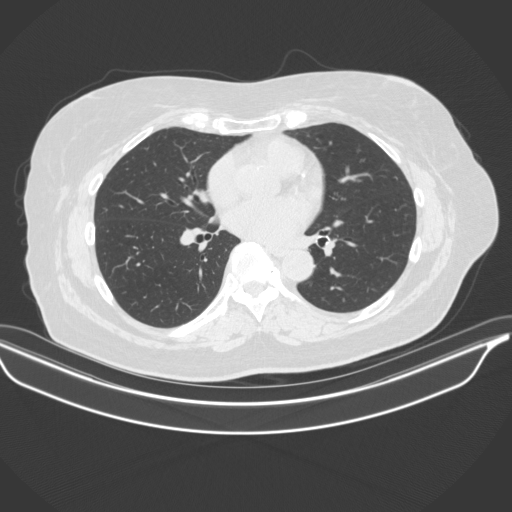
[im 101/182  mediastinal]
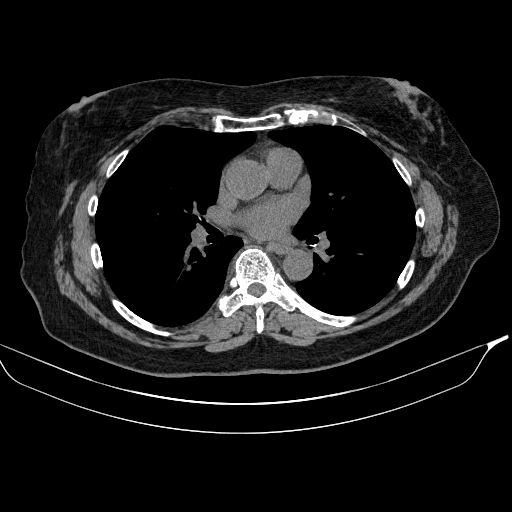
[im 101/182  lung]
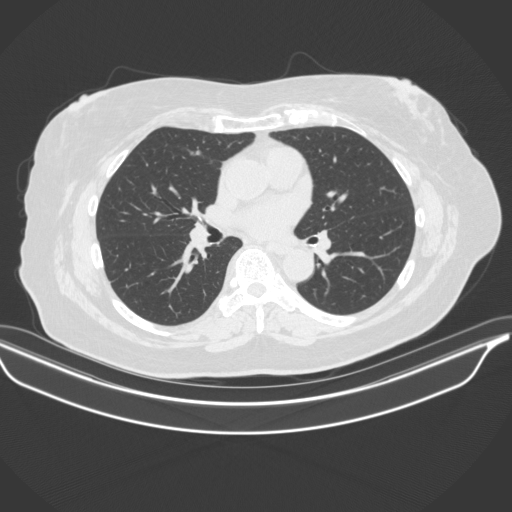
[im 109/182  lung]
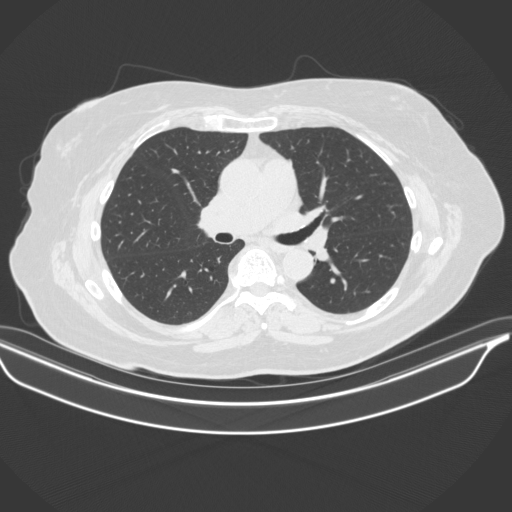
[im 121/182  lung]
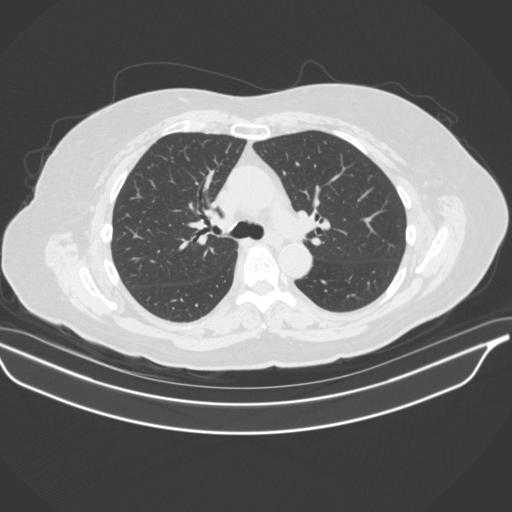
[im 135/182  lung]
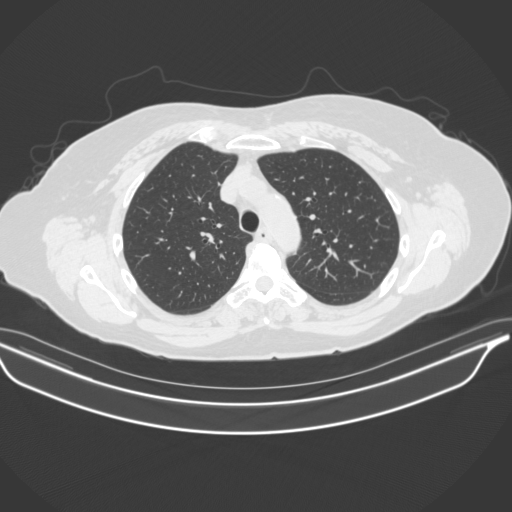
[im 145/182  mediastinal]
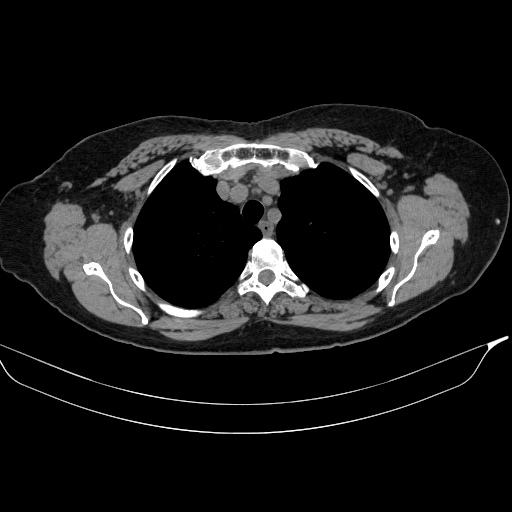
[im 145/182  lung]
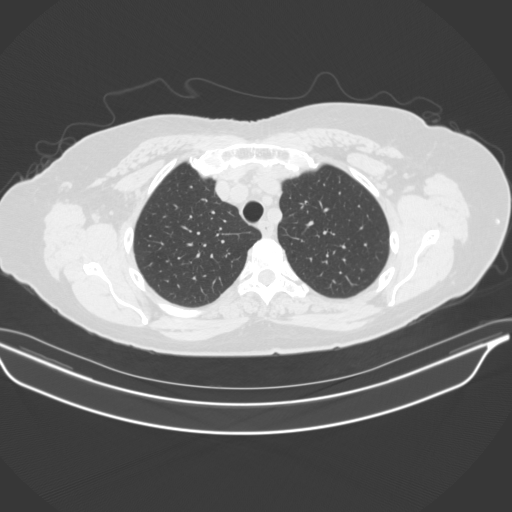
[im 155/182  lung]
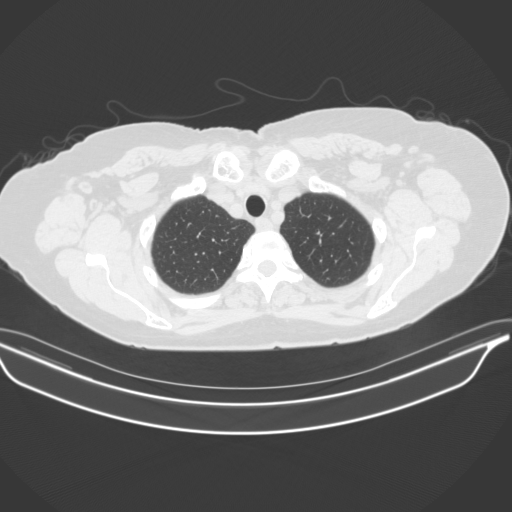
[im 168/182  lung]
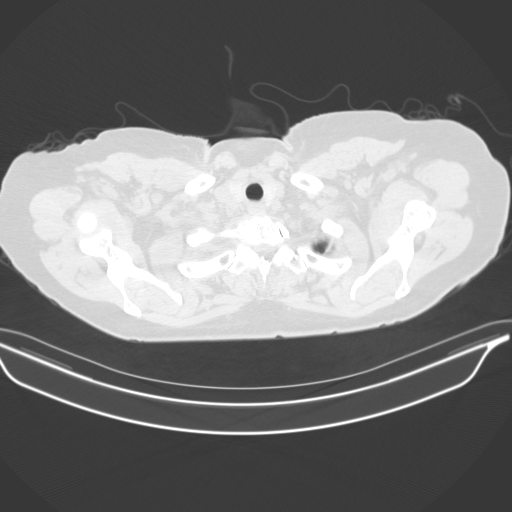

[15 of 34 positions shown; findings below may reference images not displayed]

FINDINGS: Cardiovascular: Moderate coronary artery calcification predominantly
within the proximal left anterior descending coronary artery. Global
cardiac size within normal limits. No pericardial effusion. Central
pulmonary arteries are of normal caliber. Mild atherosclerotic
calcification within the thoracic aorta. No aortic aneurysm.

Mediastinum/Nodes: No pathologic thoracic adenopathy. Esophagus
unremarkable.

Lungs/Pleura: 8 mm subpleural pulmonary nodules along the right
hemidiaphragm, axial image # 135 and laterally within the left lower
lobe, axial image # 119, are stable from prior CT examination of the
abdomen and pelvis of 01/04/2019. These are indeterminate, though
there stability favors a benign granuloma. Several triangular
intrapleural nodules are seen within the right minor fissure most in
keeping with intrapulmonary lymph nodes. No suspicious pulmonary
nodules are identified. No pneumothorax or pleural effusion. Central
airways are widely patent.

Upper Abdomen: No acute abnormality.

Musculoskeletal: Osseous structures are age-appropriate. No lytic or
blastic bone lesions are seen.
IMPRESSION: Bibasilar pulmonary nodules, indeterminate, but stable since prior
CT examination of 01/04/2019. Non-contrast chest at 18-24 months
(from the original scan) is considered optional for low-risk
patients, but is recommended for high-risk patients. This
recommendation follows the consensus statement: Guidelines for
Management of Incidental Pulmonary Nodules Detected on CT Images:

Moderate coronary artery calcification

Aortic Atherosclerosis ([id number]).

## 2022-03-30 DIAGNOSIS — L814 Other melanin hyperpigmentation: Secondary | ICD-10-CM | POA: Diagnosis not present

## 2022-03-30 DIAGNOSIS — L821 Other seborrheic keratosis: Secondary | ICD-10-CM | POA: Diagnosis not present

## 2022-03-30 DIAGNOSIS — D1801 Hemangioma of skin and subcutaneous tissue: Secondary | ICD-10-CM | POA: Diagnosis not present

## 2022-03-30 DIAGNOSIS — L578 Other skin changes due to chronic exposure to nonionizing radiation: Secondary | ICD-10-CM | POA: Diagnosis not present

## 2022-03-30 DIAGNOSIS — L570 Actinic keratosis: Secondary | ICD-10-CM | POA: Diagnosis not present

## 2022-04-21 DIAGNOSIS — L729 Follicular cyst of the skin and subcutaneous tissue, unspecified: Secondary | ICD-10-CM | POA: Diagnosis not present

## 2022-06-18 DIAGNOSIS — L209 Atopic dermatitis, unspecified: Secondary | ICD-10-CM | POA: Diagnosis not present

## 2022-08-05 ENCOUNTER — Other Ambulatory Visit: Payer: Self-pay | Admitting: Internal Medicine

## 2022-08-05 DIAGNOSIS — Z1231 Encounter for screening mammogram for malignant neoplasm of breast: Secondary | ICD-10-CM

## 2022-09-21 ENCOUNTER — Ambulatory Visit
Admission: RE | Admit: 2022-09-21 | Discharge: 2022-09-21 | Disposition: A | Discharge status: Home / Self Care | Payer: Medicare PPO | Source: Ambulatory Visit | Attending: Internal Medicine | Admitting: Internal Medicine

## 2022-09-21 DIAGNOSIS — Z1231 Encounter for screening mammogram for malignant neoplasm of breast: Secondary | ICD-10-CM

## 2022-10-27 DIAGNOSIS — Z23 Encounter for immunization: Secondary | ICD-10-CM | POA: Diagnosis not present

## 2023-02-01 DIAGNOSIS — E785 Hyperlipidemia, unspecified: Secondary | ICD-10-CM | POA: Diagnosis not present

## 2023-02-01 DIAGNOSIS — I10 Essential (primary) hypertension: Secondary | ICD-10-CM | POA: Diagnosis not present

## 2023-02-01 DIAGNOSIS — I2510 Atherosclerotic heart disease of native coronary artery without angina pectoris: Secondary | ICD-10-CM | POA: Diagnosis not present

## 2023-02-06 DIAGNOSIS — Z1212 Encounter for screening for malignant neoplasm of rectum: Secondary | ICD-10-CM | POA: Diagnosis not present

## 2023-02-08 DIAGNOSIS — E785 Hyperlipidemia, unspecified: Secondary | ICD-10-CM | POA: Diagnosis not present

## 2023-02-08 DIAGNOSIS — F419 Anxiety disorder, unspecified: Secondary | ICD-10-CM | POA: Diagnosis not present

## 2023-02-08 DIAGNOSIS — Z0000 Encounter for general adult medical examination without abnormal findings: Secondary | ICD-10-CM | POA: Diagnosis not present

## 2023-02-08 DIAGNOSIS — I10 Essential (primary) hypertension: Secondary | ICD-10-CM | POA: Diagnosis not present

## 2023-02-08 DIAGNOSIS — I2510 Atherosclerotic heart disease of native coronary artery without angina pectoris: Secondary | ICD-10-CM | POA: Diagnosis not present

## 2023-02-08 DIAGNOSIS — D72819 Decreased white blood cell count, unspecified: Secondary | ICD-10-CM | POA: Diagnosis not present

## 2023-02-08 DIAGNOSIS — I700 Atherosclerosis of aorta: Secondary | ICD-10-CM | POA: Diagnosis not present

## 2023-02-08 DIAGNOSIS — R110 Nausea: Secondary | ICD-10-CM | POA: Diagnosis not present

## 2023-03-23 ENCOUNTER — Telehealth: Payer: Self-pay | Admitting: Internal Medicine

## 2023-03-23 NOTE — Telephone Encounter (Signed)
 No further colonoscopies recommended for this patient

## 2023-03-23 NOTE — Telephone Encounter (Signed)
 Good afternoon Dr. Marina Goodell, I received a call from this patient requesting to schedule a colonoscopy. Would you please advise on scheduling.  Thank you.

## 2023-03-24 ENCOUNTER — Encounter: Payer: Self-pay | Admitting: Internal Medicine

## 2023-04-01 DIAGNOSIS — L209 Atopic dermatitis, unspecified: Secondary | ICD-10-CM | POA: Diagnosis not present

## 2023-04-01 DIAGNOSIS — L821 Other seborrheic keratosis: Secondary | ICD-10-CM | POA: Diagnosis not present

## 2023-04-01 DIAGNOSIS — D1801 Hemangioma of skin and subcutaneous tissue: Secondary | ICD-10-CM | POA: Diagnosis not present

## 2023-04-01 DIAGNOSIS — L578 Other skin changes due to chronic exposure to nonionizing radiation: Secondary | ICD-10-CM | POA: Diagnosis not present

## 2023-04-01 DIAGNOSIS — L570 Actinic keratosis: Secondary | ICD-10-CM | POA: Diagnosis not present

## 2023-04-01 DIAGNOSIS — L814 Other melanin hyperpigmentation: Secondary | ICD-10-CM | POA: Diagnosis not present

## 2023-04-20 DIAGNOSIS — H35342 Macular cyst, hole, or pseudohole, left eye: Secondary | ICD-10-CM | POA: Diagnosis not present

## 2023-04-29 ENCOUNTER — Emergency Department (HOSPITAL_COMMUNITY)

## 2023-04-29 ENCOUNTER — Encounter (HOSPITAL_COMMUNITY): Payer: Self-pay | Admitting: *Deleted

## 2023-04-29 ENCOUNTER — Other Ambulatory Visit: Payer: Self-pay

## 2023-04-29 ENCOUNTER — Emergency Department (HOSPITAL_COMMUNITY)
Admission: EM | Admit: 2023-04-29 | Discharge: 2023-04-30 | Disposition: A | Discharge status: Home / Self Care | Attending: Emergency Medicine | Admitting: Emergency Medicine

## 2023-04-29 DIAGNOSIS — I700 Atherosclerosis of aorta: Secondary | ICD-10-CM | POA: Insufficient documentation

## 2023-04-29 DIAGNOSIS — Z85828 Personal history of other malignant neoplasm of skin: Secondary | ICD-10-CM | POA: Diagnosis not present

## 2023-04-29 DIAGNOSIS — Z7982 Long term (current) use of aspirin: Secondary | ICD-10-CM | POA: Insufficient documentation

## 2023-04-29 DIAGNOSIS — Z0189 Encounter for other specified special examinations: Secondary | ICD-10-CM | POA: Diagnosis not present

## 2023-04-29 DIAGNOSIS — Z79899 Other long term (current) drug therapy: Secondary | ICD-10-CM | POA: Insufficient documentation

## 2023-04-29 DIAGNOSIS — E785 Hyperlipidemia, unspecified: Secondary | ICD-10-CM | POA: Insufficient documentation

## 2023-04-29 DIAGNOSIS — R479 Unspecified speech disturbances: Secondary | ICD-10-CM | POA: Diagnosis not present

## 2023-04-29 DIAGNOSIS — I10 Essential (primary) hypertension: Secondary | ICD-10-CM | POA: Diagnosis not present

## 2023-04-29 LAB — DIFFERENTIAL
Abs Immature Granulocytes: 0.01 10*3/uL (ref 0.00–0.07)
Basophils Absolute: 0 10*3/uL (ref 0.0–0.1)
Basophils Relative: 1 %
Eosinophils Absolute: 0.1 10*3/uL (ref 0.0–0.5)
Eosinophils Relative: 2 %
Immature Granulocytes: 0 %
Lymphocytes Relative: 48 %
Lymphs Abs: 2.1 10*3/uL (ref 0.7–4.0)
Monocytes Absolute: 0.3 10*3/uL (ref 0.1–1.0)
Monocytes Relative: 6 %
Neutro Abs: 1.9 10*3/uL (ref 1.7–7.7)
Neutrophils Relative %: 43 %

## 2023-04-29 LAB — APTT: aPTT: 26 s (ref 24–36)

## 2023-04-29 LAB — CBC
HCT: 35.1 % — ABNORMAL LOW (ref 36.0–46.0)
Hemoglobin: 12.1 g/dL (ref 12.0–15.0)
MCH: 31.6 pg (ref 26.0–34.0)
MCHC: 34.5 g/dL (ref 30.0–36.0)
MCV: 91.6 fL (ref 80.0–100.0)
Platelets: 253 10*3/uL (ref 150–400)
RBC: 3.83 MIL/uL — ABNORMAL LOW (ref 3.87–5.11)
RDW: 12.8 % (ref 11.5–15.5)
WBC: 4.4 10*3/uL (ref 4.0–10.5)
nRBC: 0 % (ref 0.0–0.2)

## 2023-04-29 LAB — COMPREHENSIVE METABOLIC PANEL WITH GFR
ALT: 22 U/L (ref 0–44)
AST: 27 U/L (ref 15–41)
Albumin: 3.8 g/dL (ref 3.5–5.0)
Alkaline Phosphatase: 62 U/L (ref 38–126)
Anion gap: 16 — ABNORMAL HIGH (ref 5–15)
BUN: 11 mg/dL (ref 8–23)
CO2: 22 mmol/L (ref 22–32)
Calcium: 9.3 mg/dL (ref 8.9–10.3)
Chloride: 97 mmol/L — ABNORMAL LOW (ref 98–111)
Creatinine, Ser: 0.76 mg/dL (ref 0.44–1.00)
GFR, Estimated: >60 mL/min (ref >60)
Glucose, Bld: 94 mg/dL (ref 70–99)
Potassium: 3.9 mmol/L (ref 3.5–5.1)
Sodium: 135 mmol/L (ref 135–145)
Total Bilirubin: 0.4 mg/dL (ref 0.0–1.2)
Total Protein: 6.1 g/dL — ABNORMAL LOW (ref 6.5–8.1)

## 2023-04-29 LAB — URINALYSIS, ROUTINE W REFLEX MICROSCOPIC
Bilirubin Urine: NEGATIVE
Glucose, UA: NEGATIVE mg/dL
Ketones, ur: NEGATIVE mg/dL
Leukocytes,Ua: NEGATIVE
Nitrite: NEGATIVE
Protein, ur: NEGATIVE mg/dL
Specific Gravity, Urine: 1.003 — ABNORMAL LOW (ref 1.005–1.030)
pH: 7 (ref 5.0–8.0)

## 2023-04-29 LAB — ETHANOL: Alcohol, Ethyl (B): <15 mg/dL (ref <15)

## 2023-04-29 LAB — CBG MONITORING, ED: Glucose-Capillary: 95 mg/dL (ref 70–99)

## 2023-04-29 LAB — PROTIME-INR
INR: 0.9 (ref 0.8–1.2)
Prothrombin Time: 12.8 s (ref 11.4–15.2)

## 2023-04-29 NOTE — ED Triage Notes (Signed)
 The pt reports that she thinks she had a tia  for one week she reports that the words coming from her mouth are not always appropriate she walked in a and o x 4  moves all extremities

## 2023-04-29 NOTE — ED Provider Notes (Signed)
 [place] [facility]MENT AT [facility] Provider Note   CSN: [id number] Arrival date & time: 04/29/23  2000     History  Chief Complaint  Patient presents with   words not coming out correctly    Kathryn Lee is a 78 y.o. female.  The history is provided by the patient.  Kathryn Lee is a 78 y.o. female who presents to the Emergency Department complaining of difficulty speaking.  She presents to the emergency department accompanied by family for evaluation of 1 week of difficulty speaking.  She feels like she knows the words in her head but is having difficulty getting them out.  She does not feel confused and can write without difficulty.  She is completing all her ADLs without difficulty.  She does not have any associated pain, headache, chest pain, abdominal pain, fever, nausea, vomiting, dysuria. Her daughter describes the episodes as intermittent pauses in sentences, which is new over the last week.  The issue is more noticeable in the evenings but can come and go throughout the day. She has a history of hypertension, hyperlipidemia.  She did recently undergo a facial treatment for skin cancer that resulted in redness and peeling to her face, which was somewhat stressful.  She did complete the treatment 1 week ago and overall her face is improving. No tobacco. Uses alcohol  daily - 3oz  wine.  No street drugs.      Home Medications Prior to Admission medications   Medication Sig Start Date End Date Taking? Authorizing Provider  thiamine  (VITAMIN B1) 100 MG tablet Take 1 tablet (100 mg total) by mouth daily. 04/30/23  Yes [name], MD  amLODipine (NORVASC) 2.5 MG tablet Take 2.5 mg by mouth daily. Patient not taking: Reported on 12/22/2020    [provider]  aspirin EC 81 MG tablet Take 81 mg by mouth daily.    [provider]  escitalopram (LEXAPRO) 10 MG tablet Take 10 mg by mouth daily.    [provider]   ezetimibe (ZETIA) 10 MG tablet Take 1 tablet by mouth daily.    [provider]  fluticasone (FLONASE) 50 MCG/ACT nasal spray as needed. 09/25/18   [provider]  fluticasone-salmeterol (ADVAIR) 100-50 MCG/ACT AEPB Inhale 1 puff into the lungs 2 (two) times daily. Patient not taking: Reported on 12/22/2020    [provider]  folic acid (FOLVITE) 400 MCG tablet Take 400 mcg by mouth daily.    [provider]  ibuprofen (ADVIL) 400 MG tablet Take 400 mg by mouth every 6 (six) hours as needed.    [provider]  LORazepam (ATIVAN) 0.5 MG tablet Take 0.5 mg by mouth at bedtime.    [provider]  losartan-hydrochlorothiazide (HYZAAR) 50-12.5 MG tablet Take 1 tablet by mouth daily. Patient not taking: Reported on 12/22/2020    [provider]  ondansetron  (ZOFRAN ) 4 MG tablet Take 1 tablet 20 minutes before drinking each half of the colonoscopy prep 07/10/18   Tobin Forts, MD  oseltamivir  (TAMIFLU ) 75 MG capsule Take 1 capsule (75 mg total) by mouth 2 (two) times daily. 12/18/20   Farris Hong, PA-C  Potassium Gluconate 80 MG TABS Take by mouth.    [provider]  simvastatin (ZOCOR) 40 MG tablet Take 40 mg by mouth daily.    [provider]  telmisartan-hydrochlorothiazide (MICARDIS HCT) 80-12.5 MG tablet Take 1 tablet by mouth daily.    [provider]  triamcinolone  cream (KENALOG) 0.1 % Apply 1 application topically 2 (two) times daily.    [provider]      Allergies    Codeine and Morphine    Review of Systems   Review of Systems  All other systems reviewed and are negative.   Physical Exam Updated Vital Signs BP (!) 148/75   Pulse 64   Temp 97.8 F (36.6 C) (Oral)   Resp 14   Ht 5\' 6"  (1.676 m)   Wt 75.8 kg   SpO2 100%   BMI 26.97 kg/m  Physical Exam Vitals and nursing note reviewed.  Constitutional:      Appearance: She is well-developed.  HENT:     Head:  Normocephalic and atraumatic.  Cardiovascular:     Rate and Rhythm: Normal rate and regular rhythm.     Heart sounds: No murmur heard. Pulmonary:     Effort: Pulmonary effort is normal. No respiratory distress.     Breath sounds: Normal breath sounds.  Abdominal:     Palpations: Abdomen is soft.     Tenderness: There is no abdominal tenderness. There is no guarding or rebound.  Musculoskeletal:        General: No tenderness.  Skin:    General: Skin is warm and dry.  Neurological:     Mental Status: She is alert and oriented to person, place, and time.     Comments: Visual fields grossly intact.  EOMI.  No asymmetry of facial movements.  Fluent speech.  5 out of 5 strength in all 4 extremities with sensation light touch intact in all 4 extremities.  Psychiatric:        Behavior: Behavior normal.     ED Results / Procedures / Treatments   Labs (all labs ordered are listed, but only abnormal results are displayed) Labs Reviewed  CBC - Abnormal; Notable for the following components:      Result Value   RBC 3.83 (*)    HCT 35.1 (*)    All other components within normal limits  COMPREHENSIVE METABOLIC PANEL WITH GFR - Abnormal; Notable for the following components:   Chloride 97 (*)    Total Protein 6.1 (*)    Anion gap 16 (*)    All other components within normal limits  URINALYSIS, ROUTINE W REFLEX MICROSCOPIC - Abnormal; Notable for the following components:   Color, Urine COLORLESS (*)    Specific Gravity, Urine 1.003 (*)    Hgb urine dipstick SMALL (*)    Bacteria, UA RARE (*)    All other components within normal limits  PROTIME-INR  APTT  DIFFERENTIAL  ETHANOL  CBG MONITORING, ED    EKG EKG Interpretation Date/Time:  Friday April 29 2023 21:08:05 EDT Ventricular Rate:  71 PR Interval:  154 QRS Duration:  84 QT Interval:  406 QTC Calculation: 441 R Axis:   76  Text Interpretation: Normal sinus rhythm Normal ECG No previous ECGs available Confirmed by [name] [phone]) on 04/29/2023 11:07:10 PM  Radiology CT ANGIO HEAD NECK W WO CM Result Date: 04/30/2023 CLINICAL DATA:  Initial evaluation for acute neuro deficit, stroke. EXAM: CT ANGIOGRAPHY HEAD AND NECK WITH AND WITHOUT CONTRAST TECHNIQUE: Multidetector CT imaging of the head and neck was performed using the standard protocol during bolus administration of intravenous contrast. Multiplanar CT image reconstructions and MIPs were obtained to evaluate the vascular anatomy. Carotid stenosis measurements (when applicable) are obtained utilizing NASCET criteria, using the distal internal carotid diameter as the denominator.  RADIATION DOSE REDUCTION: This exam was performed according to the departmental dose-optimization program which includes automated exposure control, adjustment of the mA and/or kV according to patient size and/or use of iterative reconstruction technique. CONTRAST:  75mL OMNIPAQUE  IOHEXOL  350 MG/ML SOLN COMPARISON:  Head CT from 04/29/2023. FINDINGS: CTA NECK FINDINGS Aortic arch: Visualized aortic arch within normal limits for caliber with standard 3 vessel morphology. Aortic atherosclerosis. No significant stenosis about the origin the great vessels. Right carotid system: No evidence of dissection, stenosis (50% or greater), or occlusion. Left carotid system: No evidence of dissection, stenosis (50% or greater), or occlusion. Vertebral arteries: No evidence of dissection, stenosis (50% or greater), or occlusion. Skeleton: No worrisome osseous lesions. Moderate multilevel cervical spondylosis, most pronounced at C6-7 and C7-T1. Other neck: No other acute finding. Upper chest: No other acute finding. Review of the MIP images confirms the above findings CTA HEAD FINDINGS Anterior circulation: Both internal carotid arteries are patent through the siphons without significant stenosis. 2 mm outpouching extending laterally from the cavernous right ICA could reflect a small aneurysm versus vascular  infundibulum (series 5, image 117). A1 segments patent bilaterally. Normal anterior communicating artery complex. Anterior cerebral arteries are diminutive but patent to their distal aspects without visible stenosis. No M1 stenosis or occlusion. Distal MCA branches perfused and symmetric. Posterior circulation: Both V4 segments patent without significant stenosis. Both PICA patent at their origins. Basilar patent without stenosis. Superior cerebral arteries patent bilaterally. Both PCAs patent to their distal aspects without stenosis. Venous sinuses: Grossly patent allowing for timing the contrast bolus. Anatomic variants: None significant. Review of the MIP images confirms the above findings IMPRESSION: 1. Negative CTA for large vessel occlusion or other emergent finding. 2. 2 mm outpouching extending laterally from the cavernous right ICA, could reflect a small aneurysm versus vascular infundibulum. Attention at follow-up recommended. 3.  Aortic Atherosclerosis (ICD10-I70.0). Electronically Signed   By: Virgia Griffins M.D.   On: 04/30/2023 00:36   CT Head Wo Contrast Result Date: 04/29/2023 CLINICAL DATA:  reported problems with words EXAM: CT HEAD WITHOUT CONTRAST TECHNIQUE: Contiguous axial images were obtained from the base of the skull through the vertex without intravenous contrast. RADIATION DOSE REDUCTION: This exam was performed according to the departmental dose-optimization program which includes automated exposure control, adjustment of the mA and/or kV according to patient size and/or use of iterative reconstruction technique. COMPARISON:  None Available. FINDINGS: Brain: No evidence of large-territorial acute infarction. No parenchymal hemorrhage. No mass lesion. No extra-axial collection. No mass effect or midline shift. No hydrocephalus. Basilar cisterns are patent. Vascular: No hyperdense vessel. Skull: No acute fracture or focal lesion. Sinuses/Orbits: Paranasal sinuses and mastoid air  cells are clear. Bilateral lens replacement. The orbits are unremarkable. Other: None. IMPRESSION: No acute intracranial abnormality. Electronically Signed   By: Morgane  Naveau M.D.   On: 04/29/2023 21:07    Procedures Procedures    Medications Ordered in ED Medications  iohexol  (OMNIPAQUE ) 350 MG/ML injection 75 mL (75 mLs Intravenous Contrast Given 04/30/23 0011)    ED Course/ Medical Decision Making/ A&P                                 Medical Decision Making Amount and/or Complexity of Data Reviewed Radiology: ordered.  Risk OTC drugs. Prescription drug management.   Patient here for evaluation of 1 week of speech difficulties.  No focal deficits on examination.  CT head is  negative for acute abnormality.  Labs are unremarkable.  Discussed with neurologist patient's presentation and symptoms, recommendation for CTA to rule out significant stenosis.  CTA is negative for stenosis, does demonstrate a small aneurysm, discussed this with neurology.  Feel this is stable for discharge with close outpatient neurology follow-up as well as return precautions.  Recommendation was made by neurology to initiate thiamine , will prescribe.  Plan to discharge with ongoing workup as an outpatient.  Patient and family are in agreement with plan.  Presentation is not consistent with acute CVA.        Final Clinical Impression(s) / ED Diagnoses Final diagnoses:  Difficulty with speech    Rx / DC Orders ED Discharge Orders          Ordered    Ambulatory referral to Neurology       Comments: An appointment is requested in approximately: 2 weeks   04/30/23 0103    thiamine  (VITAMIN B1) 100 MG tablet  Daily        04/30/23 0103              [name], MD 04/30/23 [phone]

## 2023-04-29 NOTE — ED Provider Triage Note (Signed)
 Emergency Medicine Provider Triage Evaluation Note  Kathryn Lee , a 78 y.o. female  was evaluated in triage.  Pt complains of trouble forming words for the past week.  Patient states that she is having a TIA.  Patient has no previous history of blood clots or strokes but states that her mom died of a stroke.  Patient has any chest pain, vision changes, new onset weakness, trouble walking, chest pain, shortness of breath, abdominal pain, neck pain.  Patient states symptoms last all day.  Review of Systems  Positive:  Negative:   Physical Exam  BP (!) 142/68 (BP Location: Left Arm)   Pulse 70   Temp 98.5 F (36.9 C)   Resp 17   Ht 5\' 6"  (1.676 m)   Wt 75.8 kg   SpO2 100%   BMI 26.97 kg/m  Gen:   Awake, no distress   Resp:  Normal effort  MSK:   Moves extremities without difficulty  Other:  Articulating words correctly without slurring of words and does not appear to struggle on exam, normal finger-nose, able to ambulate out difficulty, vision grossly intact, pupils PERRL bilaterally, no ptosis or slurring of words or facial droop  Medical Decision Making  Medically screening exam initiated at 8:41 PM.  Appropriate orders placed.  Kathryn Lee was informed that the remainder of the evaluation will be completed by another provider, this initial triage assessment does not replace that evaluation, and the importance of remaining in the ED until their evaluation is complete.  Workup initiated, patient's neuroexam was reassuring but will get labs and imaging.   Kathryn Lee, [place] 04/29/23 2042

## 2023-04-30 DIAGNOSIS — I700 Atherosclerosis of aorta: Secondary | ICD-10-CM | POA: Diagnosis not present

## 2023-04-30 MED ORDER — IOHEXOL 350 MG/ML SOLN
75.0000 mL | Freq: Once | INTRAVENOUS | Status: AC | PRN
  Administered 2023-04-30: 75 mL via INTRAVENOUS

## 2023-04-30 MED ORDER — THIAMINE HCL 100 MG PO TABS: 100.0000 mg | ORAL_TABLET | Freq: Every day | ORAL | 1 refills | Status: DC

## 2023-04-30 NOTE — Discharge Instructions (Addendum)
 The cause of your symptoms was not identified today.   Please follow up with your family doctor and Neurology.   Get rechecked immediately if you have new or concerning symptoms.

## 2023-05-03 ENCOUNTER — Ambulatory Visit: Admitting: Diagnostic Neuroimaging

## 2023-05-03 ENCOUNTER — Encounter: Payer: Self-pay | Admitting: Diagnostic Neuroimaging

## 2023-05-03 VITALS — BP 111/68 | HR 70 | Ht 65.0 in | Wt 162.0 lb

## 2023-05-03 DIAGNOSIS — R4701 Aphasia: Secondary | ICD-10-CM | POA: Diagnosis not present

## 2023-05-03 NOTE — Patient Instructions (Signed)
 MILD EXPRESSIVE APHASIA (since ~04/19/23) - check MRI brain and labs

## 2023-05-03 NOTE — Progress Notes (Signed)
 [facility]  PATIENT: Kathryn Lee DOB: May 14, 1945  REFERRING CLINICIAN: [name], MD HISTORY FROM: patient  REASON FOR VISIT: new consult   HISTORICAL  CHIEF COMPLAINT:  Chief Complaint  Patient presents with   Aphasia    Rm 6 with  Pt is well, reports she is still having some speech impairment but it has improved. No other concerns.     HISTORY OF PRESENT ILLNESS:   78 year old female here for evaluation of speech and language difficulty.  Symptoms started around April 19, 2023.  Symptoms were fairly gradual in onset.  Having trouble getting her words out.  Speech pattern has slowed down.  Went to the emergency room for evaluation last week and had CT angiogram of the head and neck which were unremarkable.  Denies any problems with vision, swallowing, arms or legs.  No triggering or aggravating factors.   REVIEW OF SYSTEMS: Full 14 system review of systems performed and negative with exception of: as per HPI.  ALLERGIES: Allergies  Allergen Reactions   Codeine     "very sick"   Morphine     "very sick"    HOME MEDICATIONS: Outpatient Medications Prior to Visit  Medication Sig Dispense Refill   aspirin EC 81 MG tablet Take 81 mg by mouth daily.     escitalopram (LEXAPRO) 10 MG tablet Take 10 mg by mouth daily.     ezetimibe (ZETIA) 10 MG tablet Take 1 tablet by mouth daily.     folic acid (FOLVITE) 400 MCG tablet Take 400 mcg by mouth daily.     ibuprofen (ADVIL) 400 MG tablet Take 400 mg by mouth every 6 (six) hours as needed.     LORazepam (ATIVAN) 0.5 MG tablet Take 0.5 mg by mouth at bedtime.     Multiple Vitamins-Minerals (MULTI-VITAMIN GUMMIES PO) Take by mouth.     Potassium Gluconate 80 MG TABS Take by mouth.     simvastatin (ZOCOR) 40 MG tablet Take 40 mg by mouth daily.     telmisartan-hydrochlorothiazide (MICARDIS HCT) 80-12.5 MG tablet Take 1 tablet by mouth daily.     thiamine (VITAMIN B1) 100 MG tablet Take 1  tablet (100 mg total) by mouth daily. 30 tablet 1   amLODipine (NORVASC) 2.5 MG tablet Take 2.5 mg by mouth daily. (Patient not taking: Reported on 12/22/2020)     fluticasone (FLONASE) 50 MCG/ACT nasal spray as needed.     fluticasone-salmeterol (ADVAIR) 100-50 MCG/ACT AEPB Inhale 1 puff into the lungs 2 (two) times daily. (Patient not taking: Reported on 12/22/2020)     losartan-hydrochlorothiazide (HYZAAR) 50-12.5 MG tablet Take 1 tablet by mouth daily. (Patient not taking: Reported on 12/22/2020)     ondansetron  (ZOFRAN ) 4 MG tablet Take 1 tablet 20 minutes before drinking each half of the colonoscopy prep 2 tablet 0   oseltamivir  (TAMIFLU ) 75 MG capsule Take 1 capsule (75 mg total) by mouth 2 (two) times daily. 10 capsule 0   triamcinolone cream (KENALOG) 0.1 % Apply 1 application topically 2 (two) times daily.     No facility-administered medications prior to visit.    PAST MEDICAL HISTORY: Past Medical History:  Diagnosis Date   Arthritis    Blurred vision, bilateral    CAD (coronary artery disease)    Cancer (HCC)    basal cell   Depression    Heart murmur    as a baby   Hyperlipidemia    Hypertension    Lung nodule  Osteoarthritis    Seasonal allergic rhinitis     PAST SURGICAL HISTORY: Past Surgical History:  Procedure Laterality Date   ABDOMINAL HYSTERECTOMY     CATARACT EXTRACTION     COLONOSCOPY     REDUCTION MAMMAPLASTY Bilateral 2004   SQUAMOUS CELL CARCINOMA EXCISION     TOTAL HIP ARTHROPLASTY      FAMILY HISTORY: Family History  Problem Relation Age of Onset   Colon cancer Mother 30   Alzheimer's disease Mother    Colon cancer Father 47   Liver cancer Father    Esophageal cancer Neg Hx    Rectal cancer Neg Hx    Stomach cancer Neg Hx     SOCIAL HISTORY: Social History   Socioeconomic History   Marital status: Married    Spouse name: Not on file   Number of children: Not on file   Years of education: Not on file   Highest education level:  Not on file  Occupational History   Not on file  Tobacco Use   Smoking status: Never   Smokeless tobacco: Never  Vaping Use   Vaping status: Never Used  Substance and Sexual Activity   Alcohol  use: Yes    Comment: daily   Drug use: Never   Sexual activity: Not on file  Other Topics Concern   Not on file  Social History Narrative   Not on file   Social Drivers of Health   Financial Resource Strain: Not on file  Food Insecurity: Not on file  Transportation Needs: Not on file  Physical Activity: Not on file  Stress: Not on file  Social Connections: Not on file  Intimate Partner Violence: Not on file     PHYSICAL EXAM  GENERAL EXAM/CONSTITUTIONAL: Vitals:  Vitals:   05/03/23 1556  BP: 111/68  Pulse: 70  Weight: 162 lb (73.5 kg)  Height: 5\' 5"  (1.651 m)   Body mass index is 26.96 kg/m. Wt Readings from Last 3 Encounters:  05/03/23 162 lb (73.5 kg)  04/29/23 167 lb 1.7 oz (75.8 kg)  12/22/20 167 lb (75.8 kg)   Patient is in no distress; well developed, nourished and groomed; neck is supple  CARDIOVASCULAR: Examination of carotid arteries is normal; no carotid bruits Regular rate and rhythm, no murmurs Examination of peripheral vascular system by observation and palpation is normal  EYES: Ophthalmoscopic exam of optic discs and posterior segments is normal; no papilledema or hemorrhages No results found.  MUSCULOSKELETAL: Gait, strength, tone, movements noted in Neurologic exam below  NEUROLOGIC: MENTAL STATUS:      No data to display         awake, alert, oriented to person, place and time recent and remote memory intact normal attention and concentration language fluent, comprehension intact, naming intact fund of knowledge appropriate SLIGHTLY SLOWER SPEECH PATTERN; ABLE TO NAME: watch, wristband, keyboard, cable  CRANIAL NERVE:  2nd - no papilledema on fundoscopic exam 2nd, 3rd, 4th, 6th - pupils equal and reactive to light, visual fields  full to confrontation, extraocular muscles intact, no nystagmus 5th - facial sensation symmetric 7th - facial strength symmetric 8th - hearing intact 9th - palate elevates symmetrically, uvula midline 11th - shoulder shrug symmetric 12th - tongue protrusion midline  MOTOR:  normal bulk and tone, full strength in the BUE, BLE  SENSORY:  normal and symmetric to light touch, temperature, vibration  COORDINATION:  finger-nose-finger, fine finger movements normal  REFLEXES:  deep tendon reflexes TRACE and symmetric  GAIT/STATION:  narrow  based gait     DIAGNOSTIC DATA (LABS, IMAGING, TESTING) - I reviewed patient records, labs, notes, testing and imaging myself where available.  Lab Results  Component Value Date   WBC 4.4 04/29/2023   HGB 12.1 04/29/2023   HCT 35.1 (L) 04/29/2023   MCV 91.6 04/29/2023   PLT 253 04/29/2023      Component Value Date/Time   NA 135 04/29/2023 2048   K 3.9 04/29/2023 2048   CL 97 (L) 04/29/2023 2048   CO2 22 04/29/2023 2048   GLUCOSE 94 04/29/2023 2048   BUN 11 04/29/2023 2048   CREATININE 0.76 04/29/2023 2048   CALCIUM 9.3 04/29/2023 2048   PROT 6.1 (L) 04/29/2023 2048   ALBUMIN 3.8 04/29/2023 2048   AST 27 04/29/2023 2048   ALT 22 04/29/2023 2048   ALKPHOS 62 04/29/2023 2048   BILITOT 0.4 04/29/2023 2048   GFRNONAA >60 04/29/2023 2048   GFRAA  06/14/2008 1635    >60        The eGFR has been calculated using the MDRD equation. This calculation has not been validated in all clinical situations. eGFR's persistently <60 mL/min signify possible Chronic Kidney Disease.   Lab Results  Component Value Date   CHOL 172 07/26/2008   HDL 72.50 07/26/2008   LDLCALC 81 07/26/2008   TRIG 95.0 07/26/2008   CHOLHDL 2 07/26/2008   Lab Results  Component Value Date   HGBA1C 5.8 (H) 12/22/2020   No results found for: "VITAMINB12" Lab Results  Component Value Date   TSH 1.45 07/26/2008    04/29/23 CTA head / neck 1. Negative CTA  for large vessel occlusion or other emergent finding. 2. 2 mm outpouching extending laterally from the cavernous right ICA, could reflect a small aneurysm versus vascular infundibulum. Attention at follow-up recommended. 3.  Aortic Atherosclerosis (ICD10-I70.0).    ASSESSMENT AND PLAN  78 y.o. year old female here with:   Dx:  1. Aphasia      PLAN:  MILD EXPRESSIVE APHASIA (gradual onset since ~04/19/23; considerations include subacute stroke, inflammatory, autoimmune, neurodegenerative or  nutritional deficiency) - check MRI brain and labs - monitor symptoms; may need collateral history from family at next visit  Orders Placed This Encounter  Procedures   MR BRAIN W WO CONTRAST   Vitamin B12   Vitamin B1   TSH Rfx on Abnormal to Free T4   Return for pending test results.    Omega Bible, MD 05/03/2023, 4:16 PM Certified in Neurology, Neurophysiology and Neuroimaging  [facility] [address] [place], [place] [address] [phone]

## 2023-05-04 ENCOUNTER — Telehealth: Payer: Self-pay | Admitting: Diagnostic Neuroimaging

## 2023-05-04 NOTE — Telephone Encounter (Signed)
 [place] Rivers: [id number] exp. 05/04/23-07/03/23 for GI

## 2023-05-06 LAB — VITAMIN B1: Thiamine: 87.5 nmol/L (ref 66.5–200.0)

## 2023-05-06 LAB — VITAMIN B12: Vitamin B-12: 524 pg/mL (ref 232–1245)

## 2023-05-06 LAB — TSH RFX ON ABNORMAL TO FREE T4: TSH: 2.65 u[IU]/mL (ref 0.450–4.500)

## 2023-05-17 ENCOUNTER — Ambulatory Visit: Payer: Self-pay | Admitting: Diagnostic Neuroimaging

## 2023-05-19 ENCOUNTER — Ambulatory Visit
Admission: RE | Admit: 2023-05-19 | Discharge: 2023-05-19 | Disposition: A | Discharge status: Home / Self Care | Source: Ambulatory Visit | Attending: Diagnostic Neuroimaging | Admitting: Diagnostic Neuroimaging

## 2023-05-19 DIAGNOSIS — R4701 Aphasia: Secondary | ICD-10-CM

## 2023-05-19 MED ORDER — GADOPICLENOL 0.5 MMOL/ML IV SOLN
7.0000 mL | Freq: Once | INTRAVENOUS | Status: AC | PRN
  Administered 2023-05-19: 7 mL via INTRAVENOUS

## 2023-06-02 ENCOUNTER — Ambulatory Visit: Admitting: Internal Medicine

## 2023-06-02 ENCOUNTER — Encounter: Payer: Self-pay | Admitting: Internal Medicine

## 2023-06-02 VITALS — BP 130/70 | HR 65 | Ht 65.0 in | Wt 163.0 lb

## 2023-06-02 DIAGNOSIS — Z09 Encounter for follow-up examination after completed treatment for conditions other than malignant neoplasm: Secondary | ICD-10-CM | POA: Diagnosis not present

## 2023-06-02 DIAGNOSIS — Z800 Family history of malignant neoplasm of digestive organs: Secondary | ICD-10-CM | POA: Diagnosis not present

## 2023-06-02 DIAGNOSIS — Z860100 Personal history of colon polyps, unspecified: Secondary | ICD-10-CM

## 2023-06-02 DIAGNOSIS — R933 Abnormal findings on diagnostic imaging of other parts of digestive tract: Secondary | ICD-10-CM

## 2023-06-02 NOTE — Progress Notes (Signed)
 HISTORY OF PRESENT ILLNESS:  Kathryn Lee is a 78 y.o. female , required registrar at [employer], with past medical history as listed below who presents today regarding the need for surveillance colonoscopy.  The patient reports a family history of colon cancer in both parents.  She is undergone multiple prior colonoscopies.  Colonoscopy 2001 with hyperplastic polyp.  Colonoscopy in 2002 with adenoma.  Colonoscopy in 2005 was incomplete.  Colonoscopy in 2011 was also incomplete due to redundant colon.  Barium enema in 2011 revealed no evidence of masses or polypoid lesions.  Incidental right-sided diverticulum and tortuosity of the sigmoid colon noted.  Colonoscopy performed at [facility] December 20, 2014.  Patient was said to have a complete exam to the cecum.  Exam was said to be limited by suboptimal preparation in the right colon.  Patient reports that she was still at that time did not complete her prep.  Left-sided diverticulosis noted.  2 diminutive rectosigmoid colon polyps were removed and found to be hyperplastic.  Due to the limitations of her preparation and her family history follow-up in 3 years was recommended.  She was seen in this office July 06, 2018 regarding colonoscopy.  This was performed August 31, 2018.  The examination was aborted due to poor preparation with solid stool.  Thus, she was set up for virtual colonoscopy which was performed January 04, 2019.  No polypoid filling defects or constricting lesions were notified.  Diverticulosis noted.  Patient states that she is here today since it has been 5 years since her virtual colonoscopy.  She is interested in having another virtual colonoscopy telling me "I will not die from colon cancer".  Her GI review of systems is entirely negative.  Blood work from April 29, 2023 shows a normal hemoglobin 12.1.  REVIEW OF SYSTEMS:  All non-GI ROS negative. Past Medical History:  Diagnosis Date   Arthritis    Blurred vision, bilateral     CAD (coronary artery disease)    Cancer (HCC)    basal cell   Depression    Heart murmur    as a baby   Hyperlipidemia    Hypertension    Lung nodule    Osteoarthritis    Seasonal allergic rhinitis     Past Surgical History:  Procedure Laterality Date   ABDOMINAL HYSTERECTOMY     CATARACT EXTRACTION     COLONOSCOPY     REDUCTION MAMMAPLASTY Bilateral 2004   SQUAMOUS CELL CARCINOMA EXCISION     TOTAL HIP ARTHROPLASTY      Social History Kathryn Lee  reports that she has never smoked. She has never used smokeless tobacco. She reports current alcohol  use. She reports that she does not use drugs.  family history includes Alzheimer's disease in her mother; Colon cancer (age of onset: 35) in her mother; Colon cancer (age of onset: 49) in her father; Liver cancer in her father.  Allergies  Allergen Reactions   Codeine     "very sick"   Morphine     "very sick"       PHYSICAL EXAMINATION: Vital signs: BP 130/70   Pulse 65   Ht 5\' 5"  (1.651 m)   Wt 163 lb (73.9 kg)   BMI 27.12 kg/m   Constitutional: generally well-appearing, no acute distress Psychiatric: alert and oriented x3, cooperative Eyes: extraocular movements intact, anicteric, conjunctiva pink Mouth: oral pharynx moist, no lesions Neck: supple no lymphadenopathy Cardiovascular: heart regular rate and rhythm, no murmur Lungs: clear  to auscultation bilaterally Abdomen: soft, nontender, nondistended, no obvious ascites, no peritoneal signs, normal bowel sounds, no organomegaly Rectal: Omitted Extremities: no clubbing, cyanosis, or lower extremity edema bilaterally Skin: no lesions on visible extremities Neuro: No focal deficits.  Cranial nerves intact  ASSESSMENT:  1.  Patient with strong family history of colon cancer in both parents and personal history of colon polyps.  She presents today regarding colon cancer surveillance.  She has had multiple incomplete colonoscopies and colonoscopy was  limited by poor preparation.  She did undergo virtual colonoscopy 5 years ago which was negative for polyps or masses.  She is interested in the same.   PLAN:  1.  Virtual colonoscopy.  I agree that this is the best strategy for this patient given the above.  We both agree that if this examination is negative for significant abnormalities then no further surveillance is required. 2.  Continue ongoing general medical care with Dr. Adan Holms A total time of 45 minutes was spent preparing to see the patient, obtaining comprehensive history, performing medically appropriate physical examination, counseling and educating the patient regarding the above listed issues, ordering virtual colonoscopy, and documenting clinical information in the health record

## 2023-06-02 NOTE — Patient Instructions (Signed)
 You have been scheduled for a CT Virtual Colonoscopy on 06/22/2023 at 9:00am at [facility]. They are located at [address].  Please arrive 15 minutes prior to your appointment. You will need to go by their office at least 4 days prior to the day of your appointment to pick up contrast for the procedure. If you need to contact [facility] their number is [phone].   A Virtual colonoscopy is a medical imaging procedure which uses x-rays and computers to produce two- and three-dimensional images of the colon from the lowest part, the rectum, all the way to the lower end of the small intestine and display them on a screen. A Virtual colonoscopy can show ulcers, polyps and cancer.   _______________________________________________________  If your blood pressure at your visit was 140/90 or greater, please contact your primary care physician to follow up on this.  _______________________________________________________  If you are age 76 or older, your body mass index should be between 23-30. Your Body mass index is 27.12 kg/m. If this is out of the aforementioned range listed, please consider follow up with your Primary Care Provider.  If you are age 66 or younger, your body mass index should be between 19-25. Your Body mass index is 27.12 kg/m. If this is out of the aformentioned range listed, please consider follow up with your Primary Care Provider.   ________________________________________________________  The [facility] providers would like to encourage you to use MYCHART to communicate with providers for non-urgent requests or questions.  Due to long hold times on the telephone, sending your provider a message by [facility] may be a faster and more efficient way to get a response.  Please allow 48 business hours for a response.  Please remember that this is for non-urgent requests.  _______________________________________________________

## 2023-06-21 DIAGNOSIS — L209 Atopic dermatitis, unspecified: Secondary | ICD-10-CM | POA: Diagnosis not present

## 2023-06-22 ENCOUNTER — Ambulatory Visit
Admission: RE | Admit: 2023-06-22 | Discharge: 2023-06-22 | Disposition: A | Discharge status: Home / Self Care | Source: Ambulatory Visit | Attending: Internal Medicine | Admitting: Internal Medicine

## 2023-06-22 DIAGNOSIS — K635 Polyp of colon: Secondary | ICD-10-CM | POA: Diagnosis not present

## 2023-06-22 DIAGNOSIS — Z800 Family history of malignant neoplasm of digestive organs: Secondary | ICD-10-CM

## 2023-06-22 DIAGNOSIS — Z860100 Personal history of colon polyps, unspecified: Secondary | ICD-10-CM

## 2023-06-23 ENCOUNTER — Encounter (HOSPITAL_COMMUNITY): Payer: Self-pay

## 2023-06-23 ENCOUNTER — Emergency Department (HOSPITAL_COMMUNITY)

## 2023-06-23 ENCOUNTER — Observation Stay (HOSPITAL_COMMUNITY)
Admission: EM | Admit: 2023-06-23 | Discharge: 2023-06-24 | Disposition: A | Discharge status: Home / Self Care | Attending: Internal Medicine | Admitting: Internal Medicine

## 2023-06-23 ENCOUNTER — Other Ambulatory Visit: Payer: Self-pay

## 2023-06-23 DIAGNOSIS — R112 Nausea with vomiting, unspecified: Principal | ICD-10-CM

## 2023-06-23 DIAGNOSIS — F419 Anxiety disorder, unspecified: Secondary | ICD-10-CM | POA: Diagnosis present

## 2023-06-23 DIAGNOSIS — Z79899 Other long term (current) drug therapy: Secondary | ICD-10-CM | POA: Diagnosis not present

## 2023-06-23 DIAGNOSIS — M25551 Pain in right hip: Secondary | ICD-10-CM | POA: Diagnosis not present

## 2023-06-23 DIAGNOSIS — E860 Dehydration: Secondary | ICD-10-CM

## 2023-06-23 DIAGNOSIS — E785 Hyperlipidemia, unspecified: Secondary | ICD-10-CM | POA: Diagnosis present

## 2023-06-23 DIAGNOSIS — Z7982 Long term (current) use of aspirin: Secondary | ICD-10-CM | POA: Diagnosis not present

## 2023-06-23 DIAGNOSIS — I700 Atherosclerosis of aorta: Secondary | ICD-10-CM | POA: Diagnosis not present

## 2023-06-23 DIAGNOSIS — Z85828 Personal history of other malignant neoplasm of skin: Secondary | ICD-10-CM | POA: Insufficient documentation

## 2023-06-23 DIAGNOSIS — E871 Hypo-osmolality and hyponatremia: Secondary | ICD-10-CM

## 2023-06-23 DIAGNOSIS — K219 Gastro-esophageal reflux disease without esophagitis: Secondary | ICD-10-CM | POA: Diagnosis present

## 2023-06-23 DIAGNOSIS — I10 Essential (primary) hypertension: Secondary | ICD-10-CM | POA: Diagnosis not present

## 2023-06-23 DIAGNOSIS — M25552 Pain in left hip: Secondary | ICD-10-CM | POA: Diagnosis not present

## 2023-06-23 DIAGNOSIS — I2510 Atherosclerotic heart disease of native coronary artery without angina pectoris: Secondary | ICD-10-CM | POA: Insufficient documentation

## 2023-06-23 DIAGNOSIS — E876 Hypokalemia: Secondary | ICD-10-CM | POA: Diagnosis not present

## 2023-06-23 DIAGNOSIS — I4581 Long QT syndrome: Secondary | ICD-10-CM | POA: Insufficient documentation

## 2023-06-23 DIAGNOSIS — R9431 Abnormal electrocardiogram [ECG] [EKG]: Secondary | ICD-10-CM

## 2023-06-23 DIAGNOSIS — R1110 Vomiting, unspecified: Secondary | ICD-10-CM | POA: Diagnosis not present

## 2023-06-23 DIAGNOSIS — Z96649 Presence of unspecified artificial hip joint: Secondary | ICD-10-CM | POA: Insufficient documentation

## 2023-06-23 LAB — BASIC METABOLIC PANEL WITH GFR
Anion gap: 9 (ref 5–15)
BUN: 10 mg/dL (ref 8–23)
CO2: 22 mmol/L (ref 22–32)
Calcium: 8.2 mg/dL — ABNORMAL LOW (ref 8.9–10.3)
Chloride: 97 mmol/L — ABNORMAL LOW (ref 98–111)
Creatinine, Ser: 0.71 mg/dL (ref 0.44–1.00)
GFR, Estimated: >60 mL/min (ref >60)
Glucose, Bld: 115 mg/dL — ABNORMAL HIGH (ref 70–99)
Potassium: 2.9 mmol/L — ABNORMAL LOW (ref 3.5–5.1)
Sodium: 128 mmol/L — ABNORMAL LOW (ref 135–145)

## 2023-06-23 LAB — URINALYSIS, ROUTINE W REFLEX MICROSCOPIC
Bacteria, UA: NONE SEEN
Bilirubin Urine: NEGATIVE
Glucose, UA: NEGATIVE mg/dL
Ketones, ur: 20 mg/dL — AB
Leukocytes,Ua: NEGATIVE
Nitrite: NEGATIVE
Protein, ur: NEGATIVE mg/dL
Specific Gravity, Urine: 1.005 (ref 1.005–1.030)
pH: 8 (ref 5.0–8.0)

## 2023-06-23 LAB — MAGNESIUM: Magnesium: 1.8 mg/dL (ref 1.7–2.4)

## 2023-06-23 LAB — CBC
HCT: 37.5 % (ref 36.0–46.0)
Hemoglobin: 13.6 g/dL (ref 12.0–15.0)
MCH: 31.8 pg (ref 26.0–34.0)
MCHC: 36.3 g/dL — ABNORMAL HIGH (ref 30.0–36.0)
MCV: 87.6 fL (ref 80.0–100.0)
Platelets: 297 10*3/uL (ref 150–400)
RBC: 4.28 MIL/uL (ref 3.87–5.11)
RDW: 12.2 % (ref 11.5–15.5)
WBC: 6.2 10*3/uL (ref 4.0–10.5)
nRBC: 0 % (ref 0.0–0.2)

## 2023-06-23 LAB — COMPREHENSIVE METABOLIC PANEL WITH GFR
ALT: 18 U/L (ref 0–44)
AST: 24 U/L (ref 15–41)
Albumin: 4.3 g/dL (ref 3.5–5.0)
Alkaline Phosphatase: 63 U/L (ref 38–126)
Anion gap: 12 (ref 5–15)
BUN: 12 mg/dL (ref 8–23)
CO2: 22 mmol/L (ref 22–32)
Calcium: 9.4 mg/dL (ref 8.9–10.3)
Chloride: 90 mmol/L — ABNORMAL LOW (ref 98–111)
Creatinine, Ser: 0.76 mg/dL (ref 0.44–1.00)
GFR, Estimated: >60 mL/min (ref >60)
Glucose, Bld: 129 mg/dL — ABNORMAL HIGH (ref 70–99)
Potassium: 3.1 mmol/L — ABNORMAL LOW (ref 3.5–5.1)
Sodium: 124 mmol/L — ABNORMAL LOW (ref 135–145)
Total Bilirubin: 1.5 mg/dL — ABNORMAL HIGH (ref 0.0–1.2)
Total Protein: 7 g/dL (ref 6.5–8.1)

## 2023-06-23 LAB — SODIUM, URINE, RANDOM: Sodium, Ur: 17 mmol/L

## 2023-06-23 LAB — OSMOLALITY, URINE: Osmolality, Ur: 214 mosm/kg — ABNORMAL LOW (ref 300–900)

## 2023-06-23 LAB — LIPASE, BLOOD: Lipase: 29 U/L (ref 11–51)

## 2023-06-23 MED ORDER — KETOROLAC TROMETHAMINE 15 MG/ML IJ SOLN
15.0000 mg | Freq: Four times a day (QID) | INTRAMUSCULAR | Status: DC | PRN
Start: 2023-06-23 — End: 2023-06-24
  Administered 2023-06-23: 15 mg via INTRAVENOUS
  Filled 2023-06-23: qty 1

## 2023-06-23 MED ORDER — SODIUM CHLORIDE 0.9 % IV SOLN: INTRAVENOUS | Status: DC

## 2023-06-23 MED ORDER — POTASSIUM CHLORIDE 10 MEQ/100ML IV SOLN
10.0000 meq | INTRAVENOUS | Status: AC
  Administered 2023-06-23: 10 meq via INTRAVENOUS
  Filled 2023-06-23 (×2): qty 100

## 2023-06-23 MED ORDER — ONDANSETRON HCL 4 MG/2ML IJ SOLN
4.0000 mg | Freq: Once | INTRAMUSCULAR | Status: DC
  Filled 2023-06-23: qty 2

## 2023-06-23 MED ORDER — ACETAMINOPHEN 325 MG PO TABS: 650.0000 mg | ORAL_TABLET | Freq: Four times a day (QID) | ORAL | Status: DC | PRN

## 2023-06-23 MED ORDER — ONDANSETRON HCL 4 MG/2ML IJ SOLN
4.0000 mg | Freq: Once | INTRAMUSCULAR | Status: AC
  Administered 2023-06-23: 4 mg via INTRAVENOUS
  Filled 2023-06-23: qty 2

## 2023-06-23 MED ORDER — PANTOPRAZOLE SODIUM 40 MG IV SOLR
40.0000 mg | Freq: Once | INTRAVENOUS | Status: AC
  Administered 2023-06-23: 40 mg via INTRAVENOUS
  Filled 2023-06-23: qty 10

## 2023-06-23 MED ORDER — ENOXAPARIN SODIUM 40 MG/0.4ML IJ SOSY
40.0000 mg | PREFILLED_SYRINGE | INTRAMUSCULAR | Status: DC
  Administered 2023-06-23: 40 mg via SUBCUTANEOUS
  Filled 2023-06-23: qty 0.4

## 2023-06-23 MED ORDER — ACETAMINOPHEN 650 MG RE SUPP: 650.0000 mg | Freq: Four times a day (QID) | RECTAL | Status: DC | PRN

## 2023-06-23 MED ORDER — SODIUM CHLORIDE 0.9 % IV BOLUS: 500.0000 mL | Freq: Once | INTRAVENOUS | Status: DC

## 2023-06-23 MED ORDER — SODIUM CHLORIDE 0.9 % IV BOLUS
2000.0000 mL | Freq: Once | INTRAVENOUS | Status: AC
  Administered 2023-06-23: 2000 mL via INTRAVENOUS

## 2023-06-23 MED ORDER — LORAZEPAM 2 MG/ML IJ SOLN
0.5000 mg | Freq: Once | INTRAMUSCULAR | Status: AC
  Administered 2023-06-23: 0.5 mg via INTRAVENOUS
  Filled 2023-06-23: qty 1

## 2023-06-23 MED ORDER — POTASSIUM CHLORIDE CRYS ER 20 MEQ PO TBCR
40.0000 meq | EXTENDED_RELEASE_TABLET | Freq: Once | ORAL | Status: AC
  Administered 2023-06-23: 40 meq via ORAL
  Filled 2023-06-23: qty 2

## 2023-06-23 MED ORDER — KETOROLAC TROMETHAMINE 30 MG/ML IJ SOLN
30.0000 mg | Freq: Once | INTRAMUSCULAR | Status: AC
  Administered 2023-06-23: 30 mg via INTRAVENOUS
  Filled 2023-06-23: qty 1

## 2023-06-23 NOTE — Assessment & Plan Note (Signed)
 Check magnesium Keep on tele  Replete and trend

## 2023-06-23 NOTE — ED Provider Notes (Addendum)
 [place] EMERGENCY DEPARTMENT AT [facility] Provider Note   CSN: [id number] Arrival date & time: 06/23/23  1012     Patient presents with: Emesis   Kathryn Lee is a 78 y.o. female.   Patient virtual colonoscopy yesterday.  Patient usually with the prep runs in the trouble with dehydration and then persistent vomiting.  This is occurred even with regular colonoscopies.  Patient stated that the vomiting started 2 days ago.  She has been using Zofran  with some intermittent relief.  But family member states that normally she needs 2 L of IV fluids and that usually corrects the problem.  Past medical history significant for hypertension hyperlipidemia coronary artery disease.  Abdominal hysterectomy.  Patient is never used tobacco products.  Not associated with any abdominal pain.         Prior to Admission medications   Medication Sig Start Date End Date Taking? Authorizing Provider  aspirin EC 81 MG tablet Take 81 mg by mouth daily.    [provider]  escitalopram (LEXAPRO) 10 MG tablet Take 10 mg by mouth daily.    [provider]  ezetimibe (ZETIA) 10 MG tablet Take 1 tablet by mouth daily.    [provider]  folic acid (FOLVITE) 400 MCG tablet Take 400 mcg by mouth daily.    [provider]  ibuprofen (ADVIL) 400 MG tablet Take 400 mg by mouth every 6 (six) hours as needed.    [provider]  LORazepam (ATIVAN) 0.5 MG tablet Take 0.5 mg by mouth at bedtime.    [provider]  Multiple Vitamins-Minerals (MULTI-VITAMIN GUMMIES PO) Take by mouth.    [provider]  Potassium Gluconate 80 MG TABS Take by mouth.    [provider]  simvastatin (ZOCOR) 40 MG tablet Take 40 mg by mouth daily.    [provider]  telmisartan-hydrochlorothiazide (MICARDIS HCT) 80-12.5 MG tablet Take 1 tablet by mouth daily.    [provider]  thiamine  (VITAMIN B1) 100 MG tablet Take 1  tablet (100 mg total) by mouth daily. 04/30/23   [name], MD    Allergies: Codeine and Morphine    Review of Systems  Constitutional:  Negative for chills and fever.  HENT:  Negative for ear pain and sore throat.   Eyes:  Negative for pain and visual disturbance.  Respiratory:  Negative for cough and shortness of breath.   Cardiovascular:  Negative for chest pain and palpitations.  Gastrointestinal:  Positive for nausea and vomiting. Negative for abdominal pain.  Genitourinary:  Negative for dysuria and hematuria.  Musculoskeletal:  Negative for arthralgias and back pain.  Skin:  Negative for color change and rash.  Neurological:  Negative for seizures and syncope.  All other systems reviewed and are negative.   Updated Vital Signs BP 120/61   Pulse 67   Temp 98.3 F (36.8 C) (Oral)   Resp 16   Ht 1.651 m (5' 5)   Wt 73.9 kg   SpO2 100%   BMI 27.12 kg/m   Physical Exam Vitals and nursing note reviewed.  Constitutional:      General: She is not in acute distress.    Appearance: Normal appearance. She is well-developed.  HENT:     Head: Normocephalic and atraumatic.     Mouth/Throat:     Mouth: Mucous membranes are dry.   Eyes:     Extraocular Movements: Extraocular movements intact.     Conjunctiva/sclera: Conjunctivae normal.  Pupils: Pupils are equal, round, and reactive to light.    Cardiovascular:     Rate and Rhythm: Normal rate and regular rhythm.     Heart sounds: No murmur heard. Pulmonary:     Effort: Pulmonary effort is normal. No respiratory distress.     Breath sounds: Normal breath sounds.  Abdominal:     General: There is no distension.     Palpations: Abdomen is soft.     Tenderness: There is no abdominal tenderness. There is no guarding.   Musculoskeletal:        General: No swelling.     Cervical back: Normal range of motion and neck supple.   Skin:    General: Skin is warm and dry.     Capillary Refill: Capillary refill takes  less than 2 seconds.   Neurological:     General: No focal deficit present.     Mental Status: She is alert and oriented to person, place, and time.   Psychiatric:        Mood and Affect: Mood normal.     (all labs ordered are listed, but only abnormal results are displayed) Labs Reviewed  COMPREHENSIVE METABOLIC PANEL WITH GFR - Abnormal; Notable for the following components:      Result Value   Sodium 124 (*)    Potassium 3.1 (*)    Chloride 90 (*)    Glucose, Bld 129 (*)    Total Bilirubin 1.5 (*)    All other components within normal limits  CBC - Abnormal; Notable for the following components:   MCHC 36.3 (*)    All other components within normal limits  LIPASE, BLOOD  URINALYSIS, ROUTINE W REFLEX MICROSCOPIC    EKG: None  Radiology: No results found.   Procedures   Medications Ordered in the ED  ketorolac (TORADOL) 30 MG/ML injection 30 mg (has no administration in time range)  sodium chloride  0.9 % bolus 2,000 mL (2,000 mLs Intravenous New Bag/Given 06/23/23 1221)                                    Medical Decision Making Amount and/or Complexity of Data Reviewed Labs: ordered. Radiology: ordered.  Risk Prescription drug management. Decision regarding hospitalization.   Abdomen is soft and nontender.  Based on her past history will try 2 L of IV fluids as per family which normally corrects things.  Patient's complete metabolic panel is pending.  CBC very normal white count 6.2 hemoglobin 13.6.  Lipase also pending.  At this point in time do not feel that we need to do a CT scan.  Will see how she responds to the fluids which has been the cure in the past.  Patient has been struggling with some bilateral hip pain for the past few days.  Normally she would take Motrin at home for that to keep it under control but she has not had any for 3 days mostly had to do with the bowel prep.  She is very sensitive to narcotic pain medicine as far as making her feel  very sick.  The [place]ie we want to do right now.  So we will give try of Toradol IV for her.  Complete metabolic panel sodium a little low at 124 potassium 3.1 chloride 90 glucose 129 total bili 1.5.  That sodium of 124 is a little bit concerning.  Based on that sodium and her  still struggling some I think maybe she may require admission also potassium down a little bit at 3.1 renal function is normal.  Will get acute abdominal series just to make sure no concerns for any acute abnormalities inside the abdomen.  This is probably all related to the hyponatremia and dehydration.  Have ordered 2 L of normal saline IV fluids.  CRITICAL CARE Performed by: Johari Pinney Total critical care time: 45 minutes Critical care time was exclusive of separately billable procedures and treating other patients. Critical care was necessary to treat or prevent imminent or life-threatening deterioration. Critical care was time spent personally by me on the following activities: development of treatment plan with patient and/or surrogate as well as nursing, discussions with consultants, evaluation of patient's response to treatment, examination of patient, obtaining history from patient or surrogate, ordering and performing treatments and interventions, ordering and review of laboratory studies, ordering and review of radiographic studies, pulse oximetry and re-evaluation of patient's condition.   Final diagnoses:  Nausea and vomiting, unspecified vomiting type  Dehydration  Hyponatremia  Hypokalemia    ED Discharge Orders     None          Nicklas Barns, MD 06/23/23 1218    Nicklas Barns, MD 06/23/23 1254    Nicklas Barns, MD 06/23/23 1315

## 2023-06-23 NOTE — Assessment & Plan Note (Signed)
Continue zocor and zetia

## 2023-06-23 NOTE — H&P (Addendum)
 History and Physical    Patient: Kathryn Lee ZOX:[id number] DOB: Nov 03, 1945 DOA: 06/23/2023 DOS: the patient was seen and examined on 06/23/2023 PCP: Bertha Broad, MD  Patient coming from: Home - lives with her husband and mother in law. Ambulates independently    Chief Complaint: nausea and vomiting   HPI: Kathryn Lee is a 78 y.o. female with medical history significant of HTN, HLD, anxiety, GERD who presented to ED with nausea and vomiting. She has strong family history of colon cancer and underwent virtual colonoscopy yesterday. She typically has nausea and vomiting with prep, but this has continued longer than usual. The prep typically causes vomiting for multiple for days and she tends to get dehydrated and then requires IVF. She did take some zofran , but hasn't really seemed to help much. She doesn't feel like eating or drinking. She has had no diarrhea/BM since the colonoscopy. She is passing some gas. Abdomen with some tenderness. Her hips are hurting as she has not been able to take her NSAIDs due to N/V. She was given toradol and this has helped immensely.    Denies any fever/chills, vision changes/headaches, chest pain or palpitations, shortness of breath or cough, dysuria or leg swelling.   She has had no episodes of vomiting since 6AM, but is starting to feel quite nauseated again.   She does not smoke or drink alcohol .   ER Course:  vitals: afebrile, bp: 125/53, H:R 72, RR: 16, oxygen: 100%RA Pertinent labs: sodium: 124, potassium: 3.1, 20 ketones in urine  KUB/chest: nonobstructive bowel gas pattern. Clear lungs  In ED: given 2L IVF bolus and toradol. TRH asked to admit.    Review of Systems: As mentioned in the history of present illness. All other systems reviewed and are negative. Past Medical History:  Diagnosis Date   Arthritis    Blurred vision, bilateral    CAD (coronary artery disease)    Cancer (HCC)    basal cell   Depression     Heart murmur    as a baby   Hyperlipidemia    Hypertension    Lung nodule    Osteoarthritis    Seasonal allergic rhinitis    Past Surgical History:  Procedure Laterality Date   ABDOMINAL HYSTERECTOMY     CATARACT EXTRACTION     COLONOSCOPY     REDUCTION MAMMAPLASTY Bilateral 2004   SQUAMOUS CELL CARCINOMA EXCISION     TOTAL HIP ARTHROPLASTY     Social History:  reports that she has never smoked. She has never used smokeless tobacco. She reports current alcohol  use. She reports that she does not use drugs.  Allergies  Allergen Reactions   Codeine     very sick   Morphine     very sick    Family History  Problem Relation Age of Onset   Colon cancer Mother 79   Alzheimer's disease Mother    Colon cancer Father 21   Liver cancer Father    Esophageal cancer Neg Hx    Rectal cancer Neg Hx    Stomach cancer Neg Hx     Prior to Admission medications   Medication Sig Start Date End Date Taking? Authorizing Provider  aspirin EC 81 MG tablet Take 81 mg by mouth daily.   Yes [provider]  escitalopram (LEXAPRO) 10 MG tablet Take 10 mg by mouth daily.   Yes [provider]  ezetimibe (ZETIA) 10 MG tablet Take 1 tablet by mouth daily.  Yes [provider]  folic acid (FOLVITE) 400 MCG tablet Take 400 mcg by mouth daily.   Yes [provider]  ibuprofen (ADVIL) 400 MG tablet Take 400 mg by mouth every 6 (six) hours as needed.   Yes [provider]  LORazepam (ATIVAN) 0.5 MG tablet Take 0.5 mg by mouth at bedtime.   Yes [provider]  MAGNESIUM GLYCINATE PO Take 1 capsule by mouth daily.   Yes [provider]  Potassium Gluconate 80 MG TABS Take by mouth.   Yes [provider]  simvastatin (ZOCOR) 40 MG tablet Take 40 mg by mouth daily.   Yes [provider]  telmisartan-hydrochlorothiazide (MICARDIS HCT) 80-12.5 MG tablet Take 1 tablet by mouth daily.   Yes [provider]  Multiple  Vitamins-Minerals (MULTI-VITAMIN GUMMIES PO) Take by mouth. Patient not taking: Reported on 06/23/2023    [provider]  thiamine  (VITAMIN B1) 100 MG tablet Take 1 tablet (100 mg total) by mouth daily. Patient not taking: Reported on 06/23/2023 04/30/23   Kathryn Patricia, MD    Physical Exam: Vitals:   06/23/23 1047 06/23/23 1315 06/23/23 1445 06/23/23 1510  BP:  (!) 147/63 (!) 145/67   Pulse:  67 67   Resp:  (!) 25 14   Temp:    98.3 F (36.8 C)  TempSrc:    Oral  SpO2:  100% 100%   Weight: 73.9 kg     Height: 5' 5 (1.651 m)      General:  Appears calm, but uncomfortable.  Eyes:  PERRL, EOMI, normal lids, iris ENT:  grossly normal hearing, lips & tongue, mmm; appropriate dentition Neck:  no LAD, masses or thyromegaly; no carotid bruits Cardiovascular:  RRR, no m/r/g. No LE edema.  Respiratory:   CTA bilaterally with no wheezes/rales/rhonchi.  Normal respiratory effort. Abdomen:  soft, NT, mild TTP in lower quadrants. No rebound or guarding.  Back:   normal alignment, no CVAT Skin:  no rash or induration seen on limited exam Musculoskeletal:  grossly normal tone BUE/BLE, good ROM, no bony abnormality Lower extremity:  No LE edema.  Limited foot exam with no ulcerations.  2+ distal pulses. Psychiatric:  grossly normal mood and affect, speech fluent and appropriate, AOx3 Neurologic:  CN 2-12 grossly intact, moves all extremities in coordinated fashion, sensation intact   Radiological Exams on Admission: Independently reviewed - see discussion in A/P where applicable  DG Abd Acute W/Chest Result Date: 06/23/2023 CLINICAL DATA:  Emesis EXAM: DG ABDOMEN ACUTE WITH 1 VIEW CHEST COMPARISON:  None Available. FINDINGS: Nonobstructive bowel gas pattern. Contrast either within the colon in the pelvis. No pneumoperitoneum. No organomegaly or radiopaque calculi. No acute fracture or destructive lesion. Multilevel degenerative disc disease of the spine. Bilateral hip arthroplasties  are anatomically aligned without dislocation. No cardiomegaly. Aortic atherosclerosis.No focal airspace consolidation, pleural effusion, or pneumothorax. IMPRESSION: Nonobstructive bowel gas pattern.  Clear lungs. Electronically Signed   By: Rance Burrows M.D.   On: 06/23/2023 14:31    EKG: Independently reviewed.  NSR with rate 77; nonspecific ST changes with no evidence of acute ischemia. Prolonged QT    Labs on Admission: I have personally reviewed the available labs and imaging studies at the time of the admission.  Pertinent labs:   sodium: 124,  potassium: 3.1,  20 ketones in urine   Assessment and Plan: Principal Problem:   Hyponatremia Active Problems:   Nausea and vomiting   Hypokalemia   Prolonged QT interval  Essential hypertension   Hyperlipidemia LDL goal <70   Anxiety disorder   Bilateral hip pain    Assessment and Plan: * Hyponatremia 78 year old female presenting to ED with 2 day history of nausea and vomiting following bowel prep for virtual colonoscopy found to be hyponatremic to 124  -obs to tele  -no hx of hyponatremia, last sodium in April 2025 was normal, 135  -received 2L IVF in ED -likely secondary to hypovolemia hyponatremia in setting of bowel prep +N/V x 2 days + ketones in urine  -check bmp s/p 2L -TSH wnl in April  -will check urine studies to confirm  -continue gentle IVF hydration if repeat sodium improved  -trend   Nausea and vomiting No episodes of vomiting since 6AM Still has no desire to eat due to N/V X1 dose IV protonix  Will give ativan now with QT prolongation and see if this helps Replete electrolytes    Hypokalemia Check magnesium Keep on tele  Replete and trend   Prolonged QT interval Optimize electrolytes Keep on telemetry Avoid qt prolonging drugs  Repeat ekg in AM    Essential hypertension Continue micardis HCT   Hyperlipidemia LDL goal <70 Continue zocor and zetia   Anxiety disorder Continue lexapro  daily  Ativan at bedtime   Bilateral hip pain Hold PO ibuprofen Toradol 15mg  q6 PRN      Advance Care Planning:   Code Status: Full Code   Consults: none   DVT Prophylaxis: lovenox   Family Communication: daughter at bedside   Severity of Illness: The appropriate patient status for this patient is OBSERVATION. Observation status is judged to be reasonable and necessary in order to provide the required intensity of service to ensure the patient's safety. The patient's presenting symptoms, physical exam findings, and initial radiographic and laboratory data in the context of their medical condition is felt to place them at decreased risk for further clinical deterioration. Furthermore, it is anticipated that the patient will be medically stable for discharge from the hospital within 2 midnights of admission.   Author: Raymona Caldwell, MD 06/23/2023 4:03 PM  For on call review [email or web address].

## 2023-06-23 NOTE — ED Triage Notes (Signed)
 Pt c/o emesis x2 days.  Denies pain.  Pt she had a virtual colonoscopy yesterday.  Pt took 8mg  Zofran  prior to arrival relief.

## 2023-06-23 NOTE — Assessment & Plan Note (Addendum)
 No episodes of vomiting since 6AM Still has no desire to eat due to N/V X1 dose IV protonix  Will give ativan now with QT prolongation and see if this helps Replete electrolytes

## 2023-06-23 NOTE — Assessment & Plan Note (Addendum)
 78 year old female presenting to ED with 2 day history of nausea and vomiting following bowel prep for virtual colonoscopy found to be hyponatremic to 124  -obs to tele  -no hx of hyponatremia, last sodium in April 2025 was normal, 135  -received 2L IVF in ED -likely secondary to hypovolemia hyponatremia in setting of bowel prep +N/V x 2 days + ketones in urine  -check bmp s/p 2L -TSH wnl in April  -will check urine studies to confirm  -continue gentle IVF hydration if repeat sodium improved  -trend

## 2023-06-23 NOTE — Assessment & Plan Note (Signed)
 Optimize electrolytes Keep on telemetry Avoid qt prolonging drugs  Repeat ekg in AM

## 2023-06-23 NOTE — Assessment & Plan Note (Signed)
 Hold PO ibuprofen Toradol 15mg  q6 PRN

## 2023-06-23 NOTE — Assessment & Plan Note (Signed)
 Continue lexapro daily  Ativan at bedtime

## 2023-06-23 NOTE — Assessment & Plan Note (Signed)
 Continue micardis HCT

## 2023-06-24 DIAGNOSIS — I2510 Atherosclerotic heart disease of native coronary artery without angina pectoris: Secondary | ICD-10-CM | POA: Diagnosis not present

## 2023-06-24 DIAGNOSIS — E785 Hyperlipidemia, unspecified: Secondary | ICD-10-CM | POA: Diagnosis not present

## 2023-06-24 DIAGNOSIS — M25551 Pain in right hip: Secondary | ICD-10-CM | POA: Diagnosis not present

## 2023-06-24 DIAGNOSIS — F419 Anxiety disorder, unspecified: Secondary | ICD-10-CM | POA: Diagnosis not present

## 2023-06-24 DIAGNOSIS — E871 Hypo-osmolality and hyponatremia: Secondary | ICD-10-CM | POA: Diagnosis not present

## 2023-06-24 DIAGNOSIS — I4581 Long QT syndrome: Secondary | ICD-10-CM | POA: Diagnosis not present

## 2023-06-24 DIAGNOSIS — K219 Gastro-esophageal reflux disease without esophagitis: Secondary | ICD-10-CM | POA: Diagnosis not present

## 2023-06-24 DIAGNOSIS — M25552 Pain in left hip: Secondary | ICD-10-CM | POA: Diagnosis not present

## 2023-06-24 DIAGNOSIS — I10 Essential (primary) hypertension: Secondary | ICD-10-CM | POA: Diagnosis not present

## 2023-06-24 LAB — BASIC METABOLIC PANEL WITH GFR
Anion gap: 9 (ref 5–15)
BUN: 11 mg/dL (ref 8–23)
CO2: 21 mmol/L — ABNORMAL LOW (ref 22–32)
Calcium: 8.4 mg/dL — ABNORMAL LOW (ref 8.9–10.3)
Chloride: 99 mmol/L (ref 98–111)
Creatinine, Ser: 0.78 mg/dL (ref 0.44–1.00)
GFR, Estimated: >60 mL/min (ref >60)
Glucose, Bld: 97 mg/dL (ref 70–99)
Potassium: 3.1 mmol/L — ABNORMAL LOW (ref 3.5–5.1)
Sodium: 129 mmol/L — ABNORMAL LOW (ref 135–145)

## 2023-06-24 LAB — CBC
HCT: 30.9 % — ABNORMAL LOW (ref 36.0–46.0)
Hemoglobin: 11.1 g/dL — ABNORMAL LOW (ref 12.0–15.0)
MCH: 32.3 pg (ref 26.0–34.0)
MCHC: 35.9 g/dL (ref 30.0–36.0)
MCV: 89.8 fL (ref 80.0–100.0)
Platelets: 213 10*3/uL (ref 150–400)
RBC: 3.44 MIL/uL — ABNORMAL LOW (ref 3.87–5.11)
RDW: 12.5 % (ref 11.5–15.5)
WBC: 4.5 10*3/uL (ref 4.0–10.5)
nRBC: 0 % (ref 0.0–0.2)

## 2023-06-24 MED ORDER — LORAZEPAM 2 MG/ML IJ SOLN
0.5000 mg | Freq: Once | INTRAMUSCULAR | Status: AC
  Administered 2023-06-24: 0.5 mg via INTRAVENOUS
  Filled 2023-06-24: qty 1

## 2023-06-24 MED ORDER — POTASSIUM CHLORIDE CRYS ER 20 MEQ PO TBCR
40.0000 meq | EXTENDED_RELEASE_TABLET | ORAL | Status: DC
  Administered 2023-06-24: 40 meq via ORAL
  Filled 2023-06-24 (×2): qty 2

## 2023-06-24 NOTE — Plan of Care (Signed)

## 2023-06-24 NOTE — Discharge Summary (Signed)
 Physician Discharge Summary   Patient: Kathryn Lee MRN: [id number] DOB: April 15, 1945  Admit date:     06/23/2023  Discharge date: 06/24/23  Discharge Physician: Junita Oliva   PCP: Bertha Broad, MD   Recommendations at discharge:    Discharge to home Follow up with PCP in 7-10 days. Have chemistry checked on that visit to be reported to PCP. Talk with GI about using low volume prep before next colonoscopy  Discharge Diagnoses: Principal Problem:   Hyponatremia Active Problems:   Nausea and vomiting   Hypokalemia   Prolonged QT interval   Essential hypertension   Hyperlipidemia LDL goal <70   Anxiety disorder   Bilateral hip pain  Resolved Problems:   Gastro-esophageal reflux disease without esophagitis  Hospital Course: The patient is a 78 yr old woman who underwent a virtual colonoscopy on 06/23/2023 after completing a prep. She developed severe nausea and vomiting and abdominal pain following the procedure. She presented to the [facility] ED. She was found to have a sodium of 124, and a potassium of 3.1 which was supplemented. She was also given IV fluids and antiemetics. On 06/24/2023 the patient was able to tolerate a soft diet. Sodium has increased to 129 and her potassium was made replete.  She is doing well and will be discharged to home in fair condition.  Assessment and Plan: * Hyponatremia 78 year old female presenting to ED with 2 day history of nausea and vomiting following bowel prep for virtual colonoscopy found to be hyponatremic to 124  - she received 2L IVF in ED -likely secondary to hypovolemia hyponatremia in setting of bowel prep +N/V x 2 days + ketones in urine  -Na improved to 129  Nausea and vomiting Resolved. Tolerating a regular soft diet.  Hypokalemia Supplemented  Prolonged QT interval Resolved on repeat EKG.  Essential hypertension Continue micardis HCT   Hyperlipidemia LDL goal <70 Continue zocor and zetia   Anxiety  disorder Continue lexapro daily  Ativan at bedtime   Bilateral hip pain Hold PO ibuprofen Toradol 15mg  q6 PRN   Consultants: None Procedures performed: None  Disposition: Home Diet recommendation:  Discharge Diet Orders (From admission, onward)     Start     Ordered   06/24/23 0000  Diet - low sodium heart healthy        06/24/23 1205           Cardiac diet DISCHARGE MEDICATION: Allergies as of 06/24/2023       Reactions   Codeine    very sick   Morphine    very sick        Medication List     STOP taking these medications    MULTI-VITAMIN GUMMIES PO   thiamine  100 MG tablet Commonly known as: VITAMIN B1       TAKE these medications    aspirin EC 81 MG tablet Take 81 mg by mouth daily.   escitalopram 10 MG tablet Commonly known as: LEXAPRO Take 10 mg by mouth daily.   ezetimibe 10 MG tablet Commonly known as: ZETIA Take 1 tablet by mouth daily.   folic acid 400 MCG tablet Commonly known as: FOLVITE Take 400 mcg by mouth daily.   ibuprofen 400 MG tablet Commonly known as: ADVIL Take 400 mg by mouth every 6 (six) hours as needed.   LORazepam 0.5 MG tablet Commonly known as: ATIVAN Take 0.5 mg by mouth at bedtime.   MAGNESIUM GLYCINATE PO Take 1 capsule by mouth daily.  Potassium Gluconate 80 MG Tabs Take by mouth.   simvastatin 40 MG tablet Commonly known as: ZOCOR Take 40 mg by mouth daily.   telmisartan-hydrochlorothiazide 80-12.5 MG tablet Commonly known as: MICARDIS HCT Take 1 tablet by mouth daily.        Discharge Exam: Filed Weights   06/23/23 1047  Weight: 73.9 kg   Exam:  Constitutional:  The patient is awake, alert, and oriented x 3. No acute distress. Respiratory:  No increased work of breathing. No wheezes, rales, or rhonchi No tactile fremitus Cardiovascular:  Regular rate and rhythm No murmurs, ectopy, or gallups. No lateral PMI. No thrills. Abdomen:  Abdomen is soft, non-tender,  non-distended No hernias, masses, or organomegaly Normoactive bowel sounds.  Musculoskeletal:  No cyanosis, clubbing, or edema Skin:  No rashes, lesions, ulcers palpation of skin: no induration or nodules Neurologic:  CN 2-12 intact Sensation all 4 extremities intact Psychiatric:  Mental status Mood, affect appropriate Orientation to person, place, time  judgment and insight appear intact   Condition at discharge: fair  The results of significant diagnostics from this hospitalization (including imaging, microbiology, ancillary and laboratory) are listed below for reference.   Imaging Studies: DG Abd Acute W/Chest Result Date: 06/23/2023 CLINICAL DATA:  Emesis EXAM: DG ABDOMEN ACUTE WITH 1 VIEW CHEST COMPARISON:  None Available. FINDINGS: Nonobstructive bowel gas pattern. Contrast either within the colon in the pelvis. No pneumoperitoneum. No organomegaly or radiopaque calculi. No acute fracture or destructive lesion. Multilevel degenerative disc disease of the spine. Bilateral hip arthroplasties are anatomically aligned without dislocation. No cardiomegaly. Aortic atherosclerosis.No focal airspace consolidation, pleural effusion, or pneumothorax. IMPRESSION: Nonobstructive bowel gas pattern.  Clear lungs. Electronically Signed   By: Rance Burrows M.D.   On: 06/23/2023 14:31    Microbiology: No results found for this or any previous visit.  Labs: CBC: Recent Labs  Lab 06/23/23 1114 06/24/23 0507  WBC 6.2 4.5  HGB 13.6 11.1*  HCT 37.5 30.9*  MCV 87.6 89.8  PLT 297 213   Basic Metabolic Panel: Recent Labs  Lab 06/23/23 1114 06/23/23 1614 06/24/23 0507  NA 124* 128* 129*  K 3.1* 2.9* 3.1*  CL 90* 97* 99  CO2 22 22 21*  GLUCOSE 129* 115* 97  BUN 12 10 11   CREATININE 0.76 0.71 0.78  CALCIUM 9.4 8.2* 8.4*  MG  --  1.8  --    Liver Function Tests: Recent Labs  Lab 06/23/23 1114  AST 24  ALT 18  ALKPHOS 63  BILITOT 1.5*  PROT 7.0  ALBUMIN 4.3   CBG: No  results for input(s): GLUCAP in the last 168 hours.  Discharge time spent: greater than 30 minutes.  Signed: Shay Bartoli, DO [facility] 06/24/2023

## 2023-06-24 NOTE — Progress Notes (Signed)
   06/24/23 1207  TOC Brief Assessment  Insurance and Status Reviewed  Patient has primary care physician Yes  Home environment has been reviewed Single family home  Prior level of function: Independent  Prior/Current Home Services No current home services  Social Drivers of Health Review SDOH reviewed no interventions necessary  Readmission risk has been reviewed Yes  Transition of care needs no transition of care needs at this time

## 2023-06-27 ENCOUNTER — Ambulatory Visit: Payer: Self-pay | Admitting: Internal Medicine

## 2023-08-22 ENCOUNTER — Other Ambulatory Visit: Payer: Self-pay | Admitting: Internal Medicine

## 2023-08-22 DIAGNOSIS — Z1231 Encounter for screening mammogram for malignant neoplasm of breast: Secondary | ICD-10-CM

## 2023-09-22 ENCOUNTER — Ambulatory Visit

## 2023-09-28 ENCOUNTER — Ambulatory Visit
Admission: RE | Admit: 2023-09-28 | Discharge: 2023-09-28 | Disposition: A | Discharge status: Home / Self Care | Source: Ambulatory Visit | Attending: Internal Medicine | Admitting: Internal Medicine

## 2023-09-28 DIAGNOSIS — Z1231 Encounter for screening mammogram for malignant neoplasm of breast: Secondary | ICD-10-CM | POA: Diagnosis not present

## 2023-09-30 DIAGNOSIS — M545 Low back pain, unspecified: Secondary | ICD-10-CM | POA: Diagnosis not present

## 2023-10-03 DIAGNOSIS — L209 Atopic dermatitis, unspecified: Secondary | ICD-10-CM | POA: Diagnosis not present

## 2023-10-03 DIAGNOSIS — L821 Other seborrheic keratosis: Secondary | ICD-10-CM | POA: Diagnosis not present

## 2023-10-03 DIAGNOSIS — L578 Other skin changes due to chronic exposure to nonionizing radiation: Secondary | ICD-10-CM | POA: Diagnosis not present

## 2023-10-03 DIAGNOSIS — L570 Actinic keratosis: Secondary | ICD-10-CM | POA: Diagnosis not present

## 2023-10-03 DIAGNOSIS — L814 Other melanin hyperpigmentation: Secondary | ICD-10-CM | POA: Diagnosis not present

## 2023-10-05 DIAGNOSIS — M545 Low back pain, unspecified: Secondary | ICD-10-CM | POA: Diagnosis not present

## 2023-10-26 DIAGNOSIS — M545 Low back pain, unspecified: Secondary | ICD-10-CM | POA: Diagnosis not present

## 2024-01-19 ENCOUNTER — Encounter: Payer: Self-pay | Admitting: Neurology

## 2024-04-25 ENCOUNTER — Ambulatory Visit: Payer: Self-pay | Admitting: Neurology
